# Patient Record
Sex: Female | Born: 1940
Health system: Southern US, Community
[De-identification: ages and names within clinical notes are randomized; demographics above are authoritative.]

## PROBLEM LIST (undated history)

## (undated) DIAGNOSIS — I10 Essential (primary) hypertension: Secondary | ICD-10-CM

## (undated) DIAGNOSIS — J449 Chronic obstructive pulmonary disease, unspecified: Secondary | ICD-10-CM

## (undated) DIAGNOSIS — C801 Malignant (primary) neoplasm, unspecified: Secondary | ICD-10-CM

## (undated) DIAGNOSIS — C50919 Malignant neoplasm of unspecified site of unspecified female breast: Secondary | ICD-10-CM

## (undated) HISTORY — PX: MASTECTOMY: SHX3

## (undated) HISTORY — PX: BREAST SURGERY: SHX581

## (undated) HISTORY — DX: Essential (primary) hypertension: I10

## (undated) HISTORY — DX: Chronic obstructive pulmonary disease, unspecified: J44.9

## (undated) HISTORY — PX: TUBAL LIGATION: SHX77

## (undated) HISTORY — PX: NECK SURGERY: SHX720

## (undated) HISTORY — DX: Malignant (primary) neoplasm, unspecified: C80.1

## (undated) LAB — HM MAMMOGRAPHY

---

## 1998-03-06 ENCOUNTER — Ambulatory Visit (HOSPITAL_COMMUNITY): Admission: RE | Admit: 1998-03-06 | Discharge: 1998-03-06 | Payer: Self-pay | Admitting: Oncology

## 1999-08-27 ENCOUNTER — Encounter: Admission: RE | Admit: 1999-08-27 | Discharge: 1999-08-27 | Payer: Self-pay | Admitting: Oncology

## 1999-08-27 ENCOUNTER — Encounter: Payer: Self-pay | Admitting: Oncology

## 1999-11-21 ENCOUNTER — Other Ambulatory Visit: Admission: RE | Admit: 1999-11-21 | Discharge: 1999-11-21 | Payer: Self-pay | Admitting: *Deleted

## 2000-09-03 ENCOUNTER — Encounter: Admission: RE | Admit: 2000-09-03 | Discharge: 2000-09-03 | Payer: Self-pay | Admitting: Oncology

## 2000-09-03 ENCOUNTER — Encounter: Payer: Self-pay | Admitting: Oncology

## 2001-01-10 ENCOUNTER — Encounter: Admission: RE | Admit: 2001-01-10 | Discharge: 2001-01-10 | Payer: Self-pay | Admitting: *Deleted

## 2001-01-10 ENCOUNTER — Encounter: Payer: Self-pay | Admitting: *Deleted

## 2001-09-29 ENCOUNTER — Encounter: Admission: RE | Admit: 2001-09-29 | Discharge: 2001-09-29 | Payer: Self-pay | Admitting: Internal Medicine

## 2001-09-29 ENCOUNTER — Encounter: Payer: Self-pay | Admitting: Internal Medicine

## 2002-06-14 ENCOUNTER — Encounter: Payer: Self-pay | Admitting: Internal Medicine

## 2002-06-14 ENCOUNTER — Encounter: Admission: RE | Admit: 2002-06-14 | Discharge: 2002-06-14 | Payer: Self-pay | Admitting: Internal Medicine

## 2002-09-18 ENCOUNTER — Other Ambulatory Visit: Admission: RE | Admit: 2002-09-18 | Discharge: 2002-09-18 | Payer: Self-pay | Admitting: Internal Medicine

## 2003-08-23 ENCOUNTER — Emergency Department (HOSPITAL_COMMUNITY): Admission: EM | Admit: 2003-08-23 | Discharge: 2003-08-23 | Payer: Self-pay | Admitting: Emergency Medicine

## 2003-09-10 ENCOUNTER — Encounter: Admission: RE | Admit: 2003-09-10 | Discharge: 2003-09-10 | Payer: Self-pay | Admitting: Internal Medicine

## 2004-09-08 ENCOUNTER — Encounter: Admission: RE | Admit: 2004-09-08 | Discharge: 2004-09-08 | Payer: Self-pay | Admitting: *Deleted

## 2004-09-19 ENCOUNTER — Ambulatory Visit (HOSPITAL_COMMUNITY): Admission: RE | Admit: 2004-09-19 | Discharge: 2004-09-19 | Payer: Self-pay | Admitting: Gastroenterology

## 2004-09-24 ENCOUNTER — Ambulatory Visit (HOSPITAL_COMMUNITY): Admission: RE | Admit: 2004-09-24 | Discharge: 2004-09-24 | Payer: Self-pay | Admitting: *Deleted

## 2004-10-10 ENCOUNTER — Ambulatory Visit: Payer: Self-pay | Admitting: Cardiology

## 2004-10-27 ENCOUNTER — Ambulatory Visit: Payer: Self-pay

## 2004-12-09 ENCOUNTER — Ambulatory Visit: Payer: Self-pay | Admitting: Cardiology

## 2005-10-16 ENCOUNTER — Encounter: Admission: RE | Admit: 2005-10-16 | Discharge: 2005-10-16 | Payer: Self-pay | Admitting: *Deleted

## 2005-12-07 ENCOUNTER — Encounter: Admission: RE | Admit: 2005-12-07 | Discharge: 2005-12-07 | Payer: Self-pay | Admitting: Internal Medicine

## 2006-11-22 ENCOUNTER — Encounter: Admission: RE | Admit: 2006-11-22 | Discharge: 2006-11-22 | Payer: Self-pay | Admitting: Internal Medicine

## 2007-09-14 ENCOUNTER — Emergency Department (HOSPITAL_COMMUNITY): Admission: EM | Admit: 2007-09-14 | Discharge: 2007-09-14 | Payer: Self-pay | Admitting: Emergency Medicine

## 2007-12-29 ENCOUNTER — Encounter: Admission: RE | Admit: 2007-12-29 | Discharge: 2007-12-29 | Payer: Self-pay | Admitting: Family Medicine

## 2008-02-06 ENCOUNTER — Encounter: Admission: RE | Admit: 2008-02-06 | Discharge: 2008-02-06 | Payer: Self-pay | Admitting: Family Medicine

## 2009-01-11 ENCOUNTER — Encounter: Admission: RE | Admit: 2009-01-11 | Discharge: 2009-01-11 | Payer: Self-pay | Admitting: Otolaryngology

## 2010-07-27 ENCOUNTER — Encounter: Payer: Self-pay | Admitting: Family Medicine

## 2011-06-24 ENCOUNTER — Ambulatory Visit (INDEPENDENT_AMBULATORY_CARE_PROVIDER_SITE_OTHER): Payer: Medicare Other

## 2011-06-24 DIAGNOSIS — J42 Unspecified chronic bronchitis: Secondary | ICD-10-CM

## 2012-03-29 ENCOUNTER — Other Ambulatory Visit: Payer: Self-pay | Admitting: Family Medicine

## 2012-03-29 DIAGNOSIS — Z9012 Acquired absence of left breast and nipple: Secondary | ICD-10-CM

## 2012-03-29 DIAGNOSIS — Z853 Personal history of malignant neoplasm of breast: Secondary | ICD-10-CM

## 2012-03-29 DIAGNOSIS — Z1231 Encounter for screening mammogram for malignant neoplasm of breast: Secondary | ICD-10-CM

## 2012-04-01 ENCOUNTER — Ambulatory Visit
Admission: RE | Admit: 2012-04-01 | Discharge: 2012-04-01 | Disposition: A | Discharge status: Home / Self Care | Payer: No Typology Code available for payment source | Source: Ambulatory Visit | Attending: Family Medicine | Admitting: Family Medicine

## 2012-04-01 DIAGNOSIS — Z1231 Encounter for screening mammogram for malignant neoplasm of breast: Secondary | ICD-10-CM | POA: Diagnosis not present

## 2012-04-01 DIAGNOSIS — Z9012 Acquired absence of left breast and nipple: Secondary | ICD-10-CM

## 2012-04-01 DIAGNOSIS — Z853 Personal history of malignant neoplasm of breast: Secondary | ICD-10-CM

## 2012-06-08 DIAGNOSIS — Z23 Encounter for immunization: Secondary | ICD-10-CM | POA: Diagnosis not present

## 2012-11-12 ENCOUNTER — Ambulatory Visit (INDEPENDENT_AMBULATORY_CARE_PROVIDER_SITE_OTHER): Payer: Medicare Other | Admitting: Family Medicine

## 2012-11-12 VITALS — BP 139/80 | HR 82 | Temp 98.4°F | Resp 16 | Ht 64.0 in | Wt 148.0 lb

## 2012-11-12 DIAGNOSIS — J441 Chronic obstructive pulmonary disease with (acute) exacerbation: Secondary | ICD-10-CM | POA: Diagnosis not present

## 2012-11-12 MED ORDER — DOXYCYCLINE HYCLATE 100 MG PO CAPS: 100.0000 mg | ORAL_CAPSULE | Freq: Two times a day (BID) | ORAL | Status: DC

## 2012-11-12 MED ORDER — ALBUTEROL SULFATE HFA 108 (90 BASE) MCG/ACT IN AERS: 2.0000 | INHALATION_SPRAY | RESPIRATORY_TRACT | Status: DC | PRN

## 2012-11-12 MED ORDER — HYDROCOD POLST-CHLORPHEN POLST 10-8 MG/5ML PO LQCR: 5.0000 mL | Freq: Two times a day (BID) | ORAL | Status: DC | PRN

## 2012-11-12 MED ORDER — PREDNISONE 20 MG PO TABS: 40.0000 mg | ORAL_TABLET | Freq: Every day | ORAL | Status: DC

## 2012-11-12 NOTE — Patient Instructions (Addendum)

## 2012-11-12 NOTE — Progress Notes (Signed)
Subjective:    Patient ID: Maria Olsen, female    DOB: 1941-03-22, 72 y.o.   MRN: [id number]  HPI  Woke up 3d ago w/ severe sore thraot, HA, ache, felt horrible  Since then has started coughing and bringing up thick [place] mucous so knows she has bronchitis and severe fatigue.  Has had some chills and low grade fever - initally thought strep throat. Left ear bothering her a little and bad HAs but no sinus pressure.  Taking ibuprofen and tylenol p.m.  Has had nasal congestion and shortness of breathness, no wheezing, no CP/tightness, tol po, decreased appetite. Not sleeping due to cough.  Has tried some tussion DM.  Past Medical History  Diagnosis Date  . Cancer   . Hypertension   . COPD (chronic obstructive pulmonary disease)    No current outpatient prescriptions on file prior to visit.   No current facility-administered medications on file prior to visit.     Review of Systems  Constitutional: Positive for fever, chills, activity change, appetite change and fatigue. Negative for diaphoresis and unexpected weight change.  HENT: Positive for ear pain, congestion, sore throat and rhinorrhea. Negative for sneezing, postnasal drip and sinus pressure.   Respiratory: Positive for cough and shortness of breath. Negative for chest tightness and wheezing.   Cardiovascular: Negative for chest pain and palpitations.  Gastrointestinal: Negative for nausea, vomiting, diarrhea and constipation.  Genitourinary: Negative for dysuria, urgency and decreased urine volume.  Musculoskeletal: Positive for myalgias and arthralgias.  Neurological: Positive for headaches.  Psychiatric/Behavioral: Positive for sleep disturbance.      BP 139/80  Pulse 82  Temp(Src) 98.4 F (36.9 C) (Oral)  Resp 16  Ht 5\' 4"  (1.626 m)  Wt 148 lb (67.132 kg)  BMI 25.39 kg/m2  SpO2 96% Objective:   Physical Exam  Constitutional: She is oriented to person, place, and time. She appears well-developed and well-nourished. No  distress.  HENT:  Head: Normocephalic and atraumatic.  Right Ear: External ear normal.  Left Ear: External ear normal.  Nose: Nose normal.  Eyes: Conjunctivae are normal. Right eye exhibits no discharge. Left eye exhibits no discharge. No scleral icterus.  Neck: Neck supple. No thyromegaly present.  Cardiovascular: Normal rate, regular rhythm and normal heart sounds.   Pulmonary/Chest: Effort normal. No accessory muscle usage. No respiratory distress. She has no decreased breath sounds. She has no wheezes. She has rhonchi in the right upper field, the right lower field and the left lower field. She has no rales.  Talks in complete sentences and walks through clinic w/o apparent dyspnea on RA.  Musculoskeletal: She exhibits no edema and no tenderness.  Lymphadenopathy:    She has no cervical adenopathy.  Neurological: She is alert and oriented to person, place, and time.  Skin: Skin is warm and dry. She is not diaphoretic. No erythema.  Psychiatric: She has a normal mood and affect. Her behavior is normal.          Assessment & Plan:  COPD exacerbation - If getting any worse or not improved in 1 wk, RTC for cbc and cxr.  No interest in smoking cessation at this time. No interest in further COPD testing or routine inhalers at this time.  Meds ordered this encounter  Medications  . hydrochlorothiazide (HYDRODIURIL) 12.5 MG tablet    Sig: Take 12.5 mg by mouth daily.  Marland Kitchen doxycycline (VIBRAMYCIN) 100 MG capsule    Sig: Take 1 capsule (100 mg total) by mouth 2 (two) times  daily.    Dispense:  20 capsule    Refill:  0  . predniSONE (DELTASONE) 20 MG tablet    Sig: Take 2 tablets (40 mg total) by mouth daily.    Dispense:  10 tablet    Refill:  0  . albuterol (PROVENTIL HFA;VENTOLIN HFA) 108 (90 BASE) MCG/ACT inhaler    Sig: Inhale 2 puffs into the lungs every 4 (four) hours as needed for wheezing (cough, shortness of breath or wheezing.).    Dispense:  1 Inhaler    Refill:  1  .  chlorpheniramine-HYDROcodone (TUSSIONEX PENNKINETIC ER) 10-8 MG/5ML LQCR    Sig: Take 5 mLs by mouth every 12 (twelve) hours as needed (cough).    Dispense:  140 mL    Refill:  0

## 2013-05-02 DIAGNOSIS — Z23 Encounter for immunization: Secondary | ICD-10-CM | POA: Diagnosis not present

## 2013-06-30 ENCOUNTER — Ambulatory Visit (INDEPENDENT_AMBULATORY_CARE_PROVIDER_SITE_OTHER): Payer: Medicare Other | Admitting: Internal Medicine

## 2013-06-30 ENCOUNTER — Ambulatory Visit: Payer: Medicare Other

## 2013-06-30 VITALS — BP 118/76 | HR 93 | Temp 99.8°F | Resp 16 | Ht 64.0 in | Wt 148.4 lb

## 2013-06-30 DIAGNOSIS — R059 Cough, unspecified: Secondary | ICD-10-CM

## 2013-06-30 DIAGNOSIS — R05 Cough: Secondary | ICD-10-CM

## 2013-06-30 DIAGNOSIS — R062 Wheezing: Secondary | ICD-10-CM | POA: Diagnosis not present

## 2013-06-30 DIAGNOSIS — J42 Unspecified chronic bronchitis: Secondary | ICD-10-CM | POA: Diagnosis not present

## 2013-06-30 DIAGNOSIS — I10 Essential (primary) hypertension: Secondary | ICD-10-CM | POA: Insufficient documentation

## 2013-06-30 DIAGNOSIS — J209 Acute bronchitis, unspecified: Secondary | ICD-10-CM

## 2013-06-30 LAB — POCT CBC
Lymph, poc: 1.2 (ref 0.6–3.4)
MCH, POC: 29.4 pg (ref 27–31.2)
MCHC: 30.2 g/dL — AB (ref 31.8–35.4)
MCV: 97.2 fL — AB (ref 80–97)
MID (cbc): 0.8 (ref 0–0.9)
POC LYMPH PERCENT: 12.3 %L (ref 10–50)
Platelet Count, POC: 211 10*3/uL (ref 142–424)
RDW, POC: 15.6 %
WBC: 9.8 10*3/uL (ref 4.6–10.2)

## 2013-06-30 MED ORDER — ALBUTEROL SULFATE HFA 108 (90 BASE) MCG/ACT IN AERS: 2.0000 | INHALATION_SPRAY | Freq: Four times a day (QID) | RESPIRATORY_TRACT | Status: DC | PRN

## 2013-06-30 MED ORDER — IPRATROPIUM BROMIDE 0.02 % IN SOLN
0.5000 mg | Freq: Once | RESPIRATORY_TRACT | Status: AC
  Administered 2013-06-30: 0.5 mg via RESPIRATORY_TRACT

## 2013-06-30 MED ORDER — PREDNISONE 20 MG PO TABS: ORAL_TABLET | ORAL | Status: DC

## 2013-06-30 MED ORDER — LEVOFLOXACIN 500 MG PO TABS: 500.0000 mg | ORAL_TABLET | Freq: Every day | ORAL | Status: DC

## 2013-06-30 MED ORDER — HYDROCODONE-HOMATROPINE 5-1.5 MG/5ML PO SYRP: 5.0000 mL | ORAL_SOLUTION | Freq: Four times a day (QID) | ORAL | Status: DC | PRN

## 2013-06-30 MED ORDER — ALBUTEROL SULFATE (2.5 MG/3ML) 0.083% IN NEBU
2.5000 mg | INHALATION_SOLUTION | Freq: Once | RESPIRATORY_TRACT | Status: AC
  Administered 2013-06-30: 2.5 mg via RESPIRATORY_TRACT

## 2013-06-30 NOTE — Progress Notes (Signed)
   Subjective:    Patient ID: Maria Olsen, female    DOB: 06-23-1941, 72 y.o.   MRN: [id number]  HPI complaining of coughing wheezing and fever for 4-5 days-getting steadily worse Couldn't sleep last night/cough became slightly productive Mild sore throat No nasal congestion History of chronic lung problems though has never needed more than albuterol Current albuterol about to expire Long history of smoking  There are no active problems to display for this patient.  has hypertension  Current outpatient prescriptions:albuterol (PROVENTIL HFA;VENTOLIN HFA) 108 (90 BASE) MCG/ACT inhaler, Inhale 2 puffs into the lungs every 4 (four) hours as needed for wheezing (cough, shortness of breath or wheezing.)., Disp: 1 Inhaler, Rfl: 1;  hydrochlorothiazide (HYDRODIURIL) 12.5 MG tablet, Take 12.5 mg by mouth daily.  Review of Systems No night sweats/no chills/no recent weight loss No nausea vomiting diarrhea No urinary symptoms No rash or joint problems    Objective:   Physical Exam BP 118/76  Pulse 93  Temp(Src) 99.8 F (37.7 C) (Oral)  Resp 16  Ht 5\' 4"  (1.626 m)  Wt 148 lb 6.4 oz (67.314 kg)  BMI 25.46 kg/m2  SpO2 89% No acute distress       [facility] reading (PRIMARY) by  Dr. Josephina Gip infiltrates//flat diaphragms  Results for orders placed in visit on 06/30/13  POCT CBC      Result Value Range   WBC 9.8  4.6 - 10.2 K/uL   Lymph, poc 1.2  0.6 - 3.4   POC LYMPH PERCENT 12.3  10 - 50 %L   MID (cbc) 0.8  0 - 0.9   POC MID % 7.8  0 - 12 %M   POC Granulocyte 7.8 (*) 2 - 6.9   Granulocyte percent 79.9  37 - 80 %G   RBC 4.76  4.04 - 5.48 M/uL   Hemoglobin 14.0  12.2 - 16.2 g/dL   HCT, POC 40.9  81.1 - 47.9 %   MCV 97.2 (*) 80 - 97 fL   MCH, POC 29.4  27 - 31.2 pg   MCHC 30.2 (*) 31.8 - 35.4 g/dL   RDW, POC 91.4     Platelet Count, POC 211  142 - 424 K/uL   MPV 9.2  0 - 99.8 fL    Assessment & Plan:

## 2013-06-30 NOTE — Progress Notes (Deleted)
   Subjective:    Patient ID: Maria Olsen, female    DOB: 1940-07-26, 72 y.o.   MRN: [id number]  HPI pt here c/o of cough, wheezing, and fever.     Review of Systems     Objective:   Physical Exam        Assessment & Plan:

## 2013-09-06 ENCOUNTER — Other Ambulatory Visit: Payer: Self-pay

## 2013-09-06 DIAGNOSIS — Z1231 Encounter for screening mammogram for malignant neoplasm of breast: Secondary | ICD-10-CM

## 2013-09-06 DIAGNOSIS — Z9012 Acquired absence of left breast and nipple: Secondary | ICD-10-CM

## 2013-09-27 ENCOUNTER — Ambulatory Visit
Admission: RE | Admit: 2013-09-27 | Discharge: 2013-09-27 | Disposition: A | Discharge status: Home / Self Care | Payer: Medicare Other | Source: Ambulatory Visit

## 2013-09-27 DIAGNOSIS — Z9012 Acquired absence of left breast and nipple: Secondary | ICD-10-CM

## 2013-09-27 DIAGNOSIS — Z1231 Encounter for screening mammogram for malignant neoplasm of breast: Secondary | ICD-10-CM | POA: Diagnosis not present

## 2014-01-21 ENCOUNTER — Ambulatory Visit (INDEPENDENT_AMBULATORY_CARE_PROVIDER_SITE_OTHER): Payer: Medicare Other | Admitting: Family Medicine

## 2014-01-21 VITALS — BP 138/66 | HR 75 | Temp 98.2°F | Resp 16 | Ht 65.0 in | Wt 149.0 lb

## 2014-01-21 DIAGNOSIS — J22 Unspecified acute lower respiratory infection: Secondary | ICD-10-CM

## 2014-01-21 DIAGNOSIS — R05 Cough: Secondary | ICD-10-CM | POA: Diagnosis not present

## 2014-01-21 DIAGNOSIS — R059 Cough, unspecified: Secondary | ICD-10-CM

## 2014-01-21 DIAGNOSIS — J988 Other specified respiratory disorders: Secondary | ICD-10-CM | POA: Diagnosis not present

## 2014-01-21 MED ORDER — AZITHROMYCIN 250 MG PO TABS: ORAL_TABLET | ORAL | Status: DC

## 2014-01-21 NOTE — Patient Instructions (Addendum)
Start zpak, albuterol if needed for cough or wheeze, but if you need to use albuterol more than twice per day, persistently needing this more than the next few days or any worsening of cough or wheezing - return for recheck.   Return to the clinic or go to the nearest emergency room if any of your symptoms worsen or new symptoms occur. Bronchitis Bronchitis is inflammation of the airways that extend from the windpipe into the lungs (bronchi). The inflammation often causes mucus to develop, which leads to a cough. If the inflammation becomes severe, it may cause shortness of breath. CAUSES  Bronchitis may be caused by:   Viral infections.   Bacteria.   Cigarette smoke.   Allergens, pollutants, and other irritants.  SIGNS AND SYMPTOMS  The most common symptom of bronchitis is a frequent cough that produces mucus. Other symptoms include:  Fever.   Body aches.   Chest congestion.   Chills.   Shortness of breath.   Sore throat.  DIAGNOSIS  Bronchitis is usually diagnosed through a medical history and physical exam. Tests, such as chest X-rays, are sometimes done to rule out other conditions.  TREATMENT  You may need to avoid contact with whatever caused the problem (smoking, for example). Medicines are sometimes needed. These may include:  Antibiotics. These may be prescribed if the condition is caused by bacteria.  Cough suppressants. These may be prescribed for relief of cough symptoms.   Inhaled medicines. These may be prescribed to help open your airways and make it easier for you to breathe.   Steroid medicines. These may be prescribed for those with recurrent (chronic) bronchitis. HOME CARE INSTRUCTIONS  Get plenty of rest.   Drink enough fluids to keep your urine clear or pale yellow (unless you have a medical condition that requires fluid restriction). Increasing fluids may help thin your secretions and will prevent dehydration.   Only take  over-the-counter or prescription medicines as directed by your health care provider.  Only take antibiotics as directed. Make sure you finish them even if you start to feel better.  Avoid secondhand smoke, irritating chemicals, and strong fumes. These will make bronchitis worse. If you are a smoker, quit smoking. Consider using nicotine gum or skin patches to help control withdrawal symptoms. Quitting smoking will help your lungs heal faster.   Put a cool-mist humidifier in your bedroom at night to moisten the air. This may help loosen mucus. Change the water in the humidifier daily. You can also run the hot water in your shower and sit in the bathroom with the door closed for 5-10 minutes.   Follow up with your health care provider as directed.   Wash your hands frequently to avoid catching bronchitis again or spreading an infection to others.  SEEK MEDICAL CARE IF: Your symptoms do not improve after 1 week of treatment.  SEEK IMMEDIATE MEDICAL CARE IF:  Your fever increases.  You have chills.   You have chest pain.   You have worsening shortness of breath.   You have bloody sputum.  You faint.  You have lightheadedness.  You have a severe headache.   You vomit repeatedly. MAKE SURE YOU:   Understand these instructions.  Will watch your condition.  Will get help right away if you are not doing well or get worse. Document Released: 06/22/2005 Document Revised: 04/12/2013 Document Reviewed: 02/14/2013 [facility] Patient Information 2015 [place], [place]. This information is not intended to replace advice given to you by your health  care provider. Make sure you discuss any questions you have with your health care provider.

## 2014-01-21 NOTE — Progress Notes (Addendum)
Subjective:   This chart was scribed for Maria Ray, MD by Maria Olsen, [facility] Scribe. This patient was seen in room 4 and the patient's care was started 10:11 AM.     Patient ID: Maria Olsen, female    DOB: 02/02/1941, 73 y.o.   MRN: [id number]  HPI  HPI Comments: Maria Olsen is a 73 y.o. female who presents to [facility] with a PMHx of HTN and COPD with albuterol prescription as needed. Most recently seen 06/2013 and was treated with Albuterol and Atrovent nebulizer. Also seen in 11/2013 for COPD exacerbation. At that time pt was treated with Prednisone and Doxycycline. She now c/o a constant, moderate productive cough consisting of a thick green/gray mucous x 4 days. Pt states her cough subsided the following day after onset, however, she states cough has returned and worsened in the last 24 hours. She also reports some fatigue, chills, and HA at this time. She has not tried any OTC medications or home remedies to help manage symptoms. She admits to using her inhaler once this morning. However, she has used her inhaler while on a recent trip to [place]. Pt states she has used her inhaler about 3 times in the last week during this sickness. However, she states at baseline inhaler use is intermittent. She denies any fever, chills, CP, or SOB. Pt with known allergies to Monocid and Demerol. No other concerns this visit.  Patient Active Problem List   Diagnosis Date Noted  . HTN (hypertension) 06/30/2013   Past Medical History  Diagnosis Date  . Cancer   . Hypertension   . COPD (chronic obstructive pulmonary disease)    Past Surgical History  Procedure Laterality Date  . Breast surgery    . Cesarean section    . Neck surgery     Allergies  Allergen Reactions  . Demerol [Meperidine] Nausea And Vomiting  . Monocid [Cefonicid] Nausea And Vomiting   Prior to Admission medications   Medication Sig Start Date End Date  Taking? Authorizing Provider  albuterol (PROVENTIL HFA;VENTOLIN HFA) 108 (90 BASE) MCG/ACT inhaler Inhale 2 puffs into the lungs every 6 (six) hours as needed for wheezing or shortness of breath. 06/30/13  Yes Leandrew Koyanagi, MD   History   Social History  . Marital Status: Married    Spouse Name: N/A    Number of Children: N/A  . Years of Education: N/A   Occupational History  . Not on file.   Social History Main Topics  . Smoking status: Current Every Day Smoker    Types: Cigarettes  . Smokeless tobacco: Not on file  . Alcohol Use: Not on file  . Drug Use: Not on file  . Sexual Activity: Not on file   Other Topics Concern  . Not on file   Social History Narrative  . No narrative on file     Review of Systems  Constitutional: Positive for chills and fatigue. Negative for fever and activity change.  Respiratory: Positive for cough.   Skin: Negative for rash.  Neurological: Positive for headaches.  Psychiatric/Behavioral: Negative for confusion.     Objective:  Physical Exam  Vitals reviewed. Constitutional: She is oriented to person, place, and time. She appears well-developed and well-nourished. No distress.  HENT:  Head: Normocephalic and atraumatic.  Right Ear: Hearing, tympanic membrane, external ear and ear canal normal.  Left Ear: Hearing, tympanic membrane, external ear and ear  canal normal.  Nose: Nose normal.  Mouth/Throat: Oropharynx is clear and moist. No oropharyngeal exudate.  Eyes: Conjunctivae and EOM are normal. Pupils are equal, round, and reactive to light.  Cardiovascular: Normal rate, regular rhythm, normal heart sounds and intact distal pulses.   No murmur heard. Pulmonary/Chest: Effort normal and breath sounds normal. No respiratory distress. She has no wheezes. She has no rhonchi. She has no rales.  Neurological: She is alert and oriented to person, place, and time.  Skin: Skin is warm and dry. No rash noted.  Psychiatric: She has a  normal mood and affect. Her behavior is normal.     Filed Vitals:   01/21/14 0953  BP: 138/66  Pulse: 75  Temp: 98.2 F (36.8 C)  Resp: 16  Height: 5\' 5"  (1.651 m)  Weight: 149 lb (67.586 kg)  SpO2: 95%      Assessment & Plan:   Maria Olsen is a 73 y.o. female Cough - Plan: azithromycin (ZITHROMAX) 250 MG tablet  Lower respiratory infection - Plan: azithromycin (ZITHROMAX) 250 MG tablet  Possible initial viral infection, with secondary sickening last night. No wheeze on exam today, and only occasional albuterol use - does not appear to be full COPD exacerbation at this time. Will start Zpak (some GI intolerance to doxycycline prior). Albuterol if needed, and if increased albuterol use, or worsening - rtc as may need prednisone or further eval.   Will schedule physical, and can discuss COPD at that time to determine if daily med needed.   rtc precautions.    Meds ordered this encounter  Medications  . azithromycin (ZITHROMAX) 250 MG tablet    Sig: Take 2 pills by mouth on day 1, then 1 pill by mouth per day on days 2 through 5.    Dispense:  5 tablet    Refill:  0   Patient Instructions  Start zpak, albuterol if needed for cough or wheeze, but if you need to use albuterol more than twice per day, persistently needing this more than the next few days or any worsening of cough or wheezing - return for recheck.   Return to the clinic or go to the nearest emergency room if any of your symptoms worsen or new symptoms occur. Bronchitis Bronchitis is inflammation of the airways that extend from the windpipe into the lungs (bronchi). The inflammation often causes mucus to develop, which leads to a cough. If the inflammation becomes severe, it may cause shortness of breath. CAUSES  Bronchitis may be caused by:   Viral infections.   Bacteria.   Cigarette smoke.   Allergens, pollutants, and other irritants.  SIGNS AND SYMPTOMS  The most common symptom of bronchitis is  a frequent cough that produces mucus. Other symptoms include:  Fever.   Body aches.   Chest congestion.   Chills.   Shortness of breath.   Sore throat.  DIAGNOSIS  Bronchitis is usually diagnosed through a medical history and physical exam. Tests, such as chest X-rays, are sometimes done to rule out other conditions.  TREATMENT  You may need to avoid contact with whatever caused the problem (smoking, for example). Medicines are sometimes needed. These may include:  Antibiotics. These may be prescribed if the condition is caused by bacteria.  Cough suppressants. These may be prescribed for relief of cough symptoms.   Inhaled medicines. These may be prescribed to help open your airways and make it easier for you to breathe.   Steroid medicines. These may be  prescribed for those with recurrent (chronic) bronchitis. HOME CARE INSTRUCTIONS  Get plenty of rest.   Drink enough fluids to keep your urine clear or pale yellow (unless you have a medical condition that requires fluid restriction). Increasing fluids may help thin your secretions and will prevent dehydration.   Only take over-the-counter or prescription medicines as directed by your health care provider.  Only take antibiotics as directed. Make sure you finish them even if you start to feel better.  Avoid secondhand smoke, irritating chemicals, and strong fumes. These will make bronchitis worse. If you are a smoker, quit smoking. Consider using nicotine gum or skin patches to help control withdrawal symptoms. Quitting smoking will help your lungs heal faster.   Put a cool-mist humidifier in your bedroom at night to moisten the air. This may help loosen mucus. Change the water in the humidifier daily. You can also run the hot water in your shower and sit in the bathroom with the door closed for 5-10 minutes.   Follow up with your health care provider as directed.   Wash your hands frequently to avoid catching  bronchitis again or spreading an infection to others.  SEEK MEDICAL CARE IF: Your symptoms do not improve after 1 week of treatment.  SEEK IMMEDIATE MEDICAL CARE IF:  Your fever increases.  You have chills.   You have chest pain.   You have worsening shortness of breath.   You have bloody sputum.  You faint.  You have lightheadedness.  You have a severe headache.   You vomit repeatedly. MAKE SURE YOU:   Understand these instructions.  Will watch your condition.  Will get help right away if you are not doing well or get worse. Document Released: 06/22/2005 Document Revised: 04/12/2013 Document Reviewed: 02/14/2013 [facility] Patient Information 2015 [place], [place]. This information is not intended to replace advice given to you by your health care provider. Make sure you discuss any questions you have with your health care provider.    I personally performed the services described in this documentation, which was scribed in my presence. The recorded information has been reviewed and considered, and addended by me as needed.

## 2014-01-22 NOTE — Progress Notes (Signed)
CPE scheduled for 05/21/14

## 2014-01-23 DIAGNOSIS — H2510 Age-related nuclear cataract, unspecified eye: Secondary | ICD-10-CM | POA: Diagnosis not present

## 2014-01-23 DIAGNOSIS — H40039 Anatomical narrow angle, unspecified eye: Secondary | ICD-10-CM | POA: Diagnosis not present

## 2014-03-25 ENCOUNTER — Ambulatory Visit (INDEPENDENT_AMBULATORY_CARE_PROVIDER_SITE_OTHER): Payer: Medicare Other | Admitting: Emergency Medicine

## 2014-03-25 VITALS — BP 126/62 | HR 76 | Temp 97.7°F | Resp 20 | Ht 64.0 in | Wt 142.1 lb

## 2014-03-25 DIAGNOSIS — J019 Acute sinusitis, unspecified: Secondary | ICD-10-CM | POA: Diagnosis not present

## 2014-03-25 MED ORDER — AMOXICILLIN 875 MG PO TABS: 875.0000 mg | ORAL_TABLET | Freq: Two times a day (BID) | ORAL | Status: DC

## 2014-03-25 NOTE — Progress Notes (Addendum)
Subjective:    Patient ID: Maria Olsen, female    DOB: 20-Apr-1941, 73 y.o.   MRN: [id number] This chart was scribed for Fraser. Everlene Farrier, MD by Steva Colder, ED Scribe. The patient was seen in room 3 at 1:34 PM.   Chief Complaint  Patient presents with  . Sinusitis    since Wednesday    HPI Maria Olsen is a 73 y.o. female with a medical hx of HTN and COPD who presents today complaining of sinusitis onset 4 days. She states that on wednesday, she thought she was having allergies. She states that on thursday, she had a HA and sinus pressure. She states that on Friday, there was mucous that was gray and some was chunks [place] and bloody. She states that she is having associated symptoms of diaphoresis, fever, dizziness, and HA. She states that she will get shooting pains sometimes and it will go up to her ear.   She states that she has tried IBU with no relief for her symptoms. She denies any other associated symptoms. She states that she is an ex-smoker and she quit in December 2104. She states that she has to use her albuterol sometimes 4 times a day. She states that this is a bad time of the year for her allergies. She states that she is allergic to Demerol and Monocid. She states that she does not like medications with codeine in it. She denies using saline sprays. She states that she has not used Zyrtec or Claritin for the symptoms. She states that she has had septaplasty gone bad. She states that she no longer has a septum.    PCP-GREENE,JEFFREY R, MD      Patient Active Problem List   Diagnosis Date Noted  . HTN (hypertension) 06/30/2013   Past Medical History  Diagnosis Date  . Cancer   . Hypertension   . COPD (chronic obstructive pulmonary disease)    Past Surgical History  Procedure Laterality Date  . Breast surgery    . Cesarean section    . Neck surgery     Allergies  Allergen Reactions  . Demerol [Meperidine] Nausea And Vomiting  . Monocid [Cefonicid] Nausea And  Vomiting   Prior to Admission medications   Medication Sig Start Date End Date Taking? Authorizing Provider  albuterol (PROVENTIL HFA;VENTOLIN HFA) 108 (90 BASE) MCG/ACT inhaler Inhale 2 puffs into the lungs every 6 (six) hours as needed for wheezing or shortness of breath. 06/30/13  Yes Leandrew Koyanagi, MD      Review of Systems  Constitutional: Positive for diaphoresis. Negative for fever.  HENT: Positive for congestion.   Neurological: Positive for dizziness and headaches.       Objective:   Physical Exam  Nursing note and vitals reviewed. Constitutional: She is oriented to person, place, and time. She appears well-developed and well-nourished. No distress.  HENT:  Head: Normocephalic and atraumatic.  Inflammation and crusting present in the nasal pharynx. Nasal septum is absent.    Eyes: EOM are normal.  Neck: Neck supple.  Cardiovascular: Normal rate.   Pulmonary/Chest: Effort normal. No respiratory distress.  Musculoskeletal: Normal range of motion.  Neurological: She is alert and oriented to person, place, and time.  Skin: Skin is warm and dry.  Psychiatric: She has a normal mood and affect. Her behavior is normal.    Meds ordered this encounter  Medications  . amoxicillin (AMOXIL) 875 MG tablet    Sig: Take 1 tablet (875 mg  total) by mouth 2 (two) times daily.    Dispense:  20 tablet    Refill:  0       BP 126/62  Pulse 76  Temp(Src) 97.7 F (36.5 C) (Oral)  Resp 20  Ht 5\' 4"  (1.626 m)  Wt 142 lb 2 oz (64.467 kg)  BMI 24.38 kg/m2  SpO2 95%  Assessment & Plan:  I personally performed the services described in this documentation, which was scribed in my presence. The recorded information has been reviewed and is accurate.  Pt states that she is able to take penicillin without any issues.    Will treat with amoxicillin and saline spray. If her dizziness persists after Abx treatment, pt needs to return to clinic for re-evaluation.

## 2014-03-25 NOTE — Patient Instructions (Signed)

## 2014-04-28 DIAGNOSIS — Z23 Encounter for immunization: Secondary | ICD-10-CM | POA: Diagnosis not present

## 2014-05-21 ENCOUNTER — Encounter: Payer: Self-pay | Admitting: Family Medicine

## 2014-05-21 ENCOUNTER — Ambulatory Visit (INDEPENDENT_AMBULATORY_CARE_PROVIDER_SITE_OTHER): Payer: Medicare Other | Admitting: Family Medicine

## 2014-05-21 VITALS — BP 147/73 | HR 74 | Temp 98.2°F | Resp 16 | Ht 64.0 in | Wt 144.4 lb

## 2014-05-21 DIAGNOSIS — E2839 Other primary ovarian failure: Secondary | ICD-10-CM

## 2014-05-21 DIAGNOSIS — Z23 Encounter for immunization: Secondary | ICD-10-CM

## 2014-05-21 DIAGNOSIS — M949 Disorder of cartilage, unspecified: Secondary | ICD-10-CM

## 2014-05-21 DIAGNOSIS — Z139 Encounter for screening, unspecified: Secondary | ICD-10-CM | POA: Diagnosis not present

## 2014-05-21 DIAGNOSIS — Z0000 Encounter for general adult medical examination without abnormal findings: Secondary | ICD-10-CM | POA: Diagnosis not present

## 2014-05-21 DIAGNOSIS — Z13220 Encounter for screening for lipoid disorders: Secondary | ICD-10-CM | POA: Diagnosis not present

## 2014-05-21 DIAGNOSIS — J42 Unspecified chronic bronchitis: Secondary | ICD-10-CM | POA: Diagnosis not present

## 2014-05-21 DIAGNOSIS — M899 Disorder of bone, unspecified: Secondary | ICD-10-CM | POA: Diagnosis not present

## 2014-05-21 DIAGNOSIS — M8580 Other specified disorders of bone density and structure, unspecified site: Secondary | ICD-10-CM

## 2014-05-21 DIAGNOSIS — Z131 Encounter for screening for diabetes mellitus: Secondary | ICD-10-CM

## 2014-05-21 LAB — COMPLETE METABOLIC PANEL WITH GFR
ALBUMIN: 4.3 g/dL (ref 3.5–5.2)
ALT: 13 U/L (ref 0–35)
AST: 20 U/L (ref 0–37)
Alkaline Phosphatase: 69 U/L (ref 39–117)
BUN: 14 mg/dL (ref 6–23)
CALCIUM: 9.6 mg/dL (ref 8.4–10.5)
CHLORIDE: 101 meq/L (ref 96–112)
CO2: 27 meq/L (ref 19–32)
CREATININE: 0.95 mg/dL (ref 0.50–1.10)
GFR, Est African American: 69 mL/min
GFR, Est Non African American: 60 mL/min
GLUCOSE: 82 mg/dL (ref 70–99)
POTASSIUM: 4.1 meq/L (ref 3.5–5.3)
Sodium: 140 mEq/L (ref 135–145)
TOTAL PROTEIN: 7.4 g/dL (ref 6.0–8.3)
Total Bilirubin: 0.6 mg/dL (ref 0.2–1.2)

## 2014-05-21 LAB — LIPID PANEL
Cholesterol: 161 mg/dL (ref 0–200)
HDL: 63 mg/dL (ref >39)
LDL CALC: 81 mg/dL (ref 0–99)
Total CHOL/HDL Ratio: 2.6 Ratio
Triglycerides: 86 mg/dL (ref <150)
VLDL: 17 mg/dL (ref 0–40)

## 2014-05-21 MED ORDER — ZOSTER VACCINE LIVE 19400 UNT/0.65ML ~~LOC~~ SOLR: 0.6500 mL | Freq: Once | SUBCUTANEOUS | Status: DC

## 2014-05-21 MED ORDER — TIOTROPIUM BROMIDE MONOHYDRATE 18 MCG IN CAPS: 18.0000 ug | ORAL_CAPSULE | Freq: Every day | RESPIRATORY_TRACT | Status: DC

## 2014-05-21 NOTE — Progress Notes (Signed)
Subjective:    Patient ID: Maria Olsen, female    DOB: Feb 05, 1941, 73 y.o.   MRN: [id number] This chart was scribed for Maria Agreste, MD by Cathie Hoops, ED Scribe. The patient was seen in Room 24. The patient's care was started at 2:12 PM.   05/21/2014  Chief Complaint  Patient presents with  . Annual Exam    without pap; pt has not fasted    HPI HPI Comments: Pt is here for her annual exam.  Maria Olsen is a 73 y.o. female who presents to the [facility] here for a complete physical exam. Pt denies having any specific medical concerns today.  COPD: She was last seen by me on 7/19 for a cough and suspected COPD exacerbation, but mild symptoms at that time. Seen in follow-up on 7/ by Dr. Everlene Farrier for sinusitis. Of note she was seen twice in 2014 for cough and suspected COPD exacerbation. Her weight in December 2014 was 148 she is 144. Today. She has not had a recent spirometry but does have albuterol if needed.   Pt is aware of having COPD, she had a low spirometry test in the past but is unable to specify the date she last received the test. She does not want to have a repeat exam but would like to continue on as if she has COPD. She denies spirometry testing today. Pt notes she currently smokes 1-2 cigarettes/day which is down from 1-2 packs/day.Pt notes she has attempted to lose weight on purpose through diet and exercise. She notes she has been using her albuterol <1x/day several times per week. Which is increased usage from her last visit in 01/2014. Pt denies ever attempting Spiriva. Pt notes associated SOB. She notes her symptoms occur when walking around a city block or walking up a flight of stairs. She notes she failed Chantix previously due to the side effects. She notes she "didn't feel right" but does not note any specific side effects.  Health Maintenance  1.) Colon Cancer Screening Her last colonoscopy was in 10/2010 with a repeat in 5 years due to  polyps.  2.) Pap Smear/ Pap Testing Pt denies pap smear and notes she has never had an abnormal pap smear. She notes she had a pap smear after age 89 and it was normal. She denies any new lumps, bumps, rashes, itching or irritation. Pt denies having issues with incontinence. Pt notes she has had some accidents "dribbling" and notes this is a more recent. She notes she does not go to the restroom frequently and sometimes believes her symptoms are due to this. She denies any incontinence with sneezing or coughing.   3.) Mammogram/Bone Density She had a normal MMG with repeat recommended in one year on 09/27/13.   Her last bone density exam was 02/2008 due to her osteopenia with a T-score of -2.3 but that was no significant change since 2007. She has previously taken Phosphomax and Neo-Calcium Nasal Spray. She was on Phosphomax once/week in 2012 by Dr. Everlene Farrier. She notes she took it more than 5+ years but currently not taking it. She states she occasionally takes Vitamin D and calcium supplements. She notes her mother had severe osteoporosis but denies she had COPD. She notes her last cup of coffee with cream in it was approximately 3 hours ago.   4.) Immunizations       A. Pneumonia Vaccine: She denies ever receiving Prevnar or Pneumovax.  B. Influenza Vaccine: She receives she received the high-dose flu vaccine at [facility] in October 2015.       C. Tdap: She notes she believes she received it but is unable to specify the date. She notes she is still working at [employer] and believes she is UTD due to this.        D. Zostovax: Pt notes she previously had Shingles 4-5 years ago. Pt is curious if she requires the vaccine. 5.) Depression Screening Pt denies feeling down, depressed or hopeless. Negative PHQ2 per questioning.   6.) Fall Screening Pt denies having falls at home with no falls within one year. Pt denies having any new risk factors for falls.  7.) Vision Screening See results below. Pt notes she  has seen a opthalmologist about one month ago and notes her pressure was normal. She notes she is pre-cataracts. She also notes she took her vision screening exam with an old eyeglass prescription.   Visual Acuity Screening   Right eye Left eye Both eyes  Without correction:     With correction: 20/40 20/20 20/20    8.) Advanced Directives Pt notes she does have a living will. Pt states that she has discussed her wishes with her family and friends. Pt notes she would like to full-code.  9.) Exercise She notes she does exercise occasionally but denies having a regular routine.  10.) Dentist Pt has regular appointments with her dentist. Pt notes she has several crowns. She last saw her dentist in June.   Review of Systems  Constitutional: Negative for fever and chills.  Respiratory: Positive for shortness of breath.   Gastrointestinal: Negative for nausea and vomiting.     Objective:   Filed Vitals:   05/21/14 1338  BP: 147/73  Pulse: 74  Temp: 98.2 F (36.8 C)  TempSrc: Oral  Resp: 16  Height: 5\' 4"  (1.626 m)  Weight: 144 lb 6.4 oz (65.499 kg)  SpO2: 97%    Physical Exam  Constitutional: She is oriented to person, place, and time. She appears well-developed and well-nourished.  HENT:  Head: Normocephalic and atraumatic.  Eyes: Conjunctivae and EOM are normal. Pupils are equal, round, and reactive to light.  Neck: Carotid bruit is not present.  Cardiovascular: Normal rate, regular rhythm, normal heart sounds and intact distal pulses.   Pulmonary/Chest: Effort normal and breath sounds normal.  Abdominal: Soft. She exhibits no pulsatile midline mass. There is no tenderness.  Neurological: She is alert and oriented to person, place, and time.  Skin: Skin is warm and dry.  Psychiatric: She has a normal mood and affect. Her behavior is normal.  Vitals reviewed.   Assessment & Plan:  2:43 PM- Patient informed of current plan for treatment and evaluation and agrees with  plan at this time. Maria Olsen is a 73 y.o. female Annual physical exam  - -anticipatory guidance as below in AVS, screening labs above. Health maintenance items as above in HPI discussed/recommended as applicable.   Osteopenia, Disorder of bone and cartilage, Estrogen deficiency   - Plan: DG Bone Density- Plan: Vit D  25 hydroxy (rtn osteoporosis monitoring),  Chronic bronchitis, unspecified chronic bronchitis type - Plan: tiotropium (SPIRIVA HANDIHALER) 18 MCG inhalation capsule  - declined spirometry, but with more frequent albuterol use - start Spiriva.   Screening for hyperlipidemia - Plan: Lipid panel  Screening for diabetes mellitus - Plan: COMPLETE METABOLIC PANEL WITH GFR  Need for shingles vaccine - Plan: zoster vaccine live, PF, ([place]) 41324  UNT/0.65ML injection   - paper Rx given in case she would like to have this, can check into coverage/cost.  Need for prophylactic vaccination against Streptococcus pneumoniae (pneumococcus) - Plan: Pneumococcal conjugate vaccine 13-valent IM.  -Prevnar given today. Plan on Pneumovax 23 at follow up in 3 months.     Meds ordered this encounter  Medications  . tiotropium (SPIRIVA HANDIHALER) 18 MCG inhalation capsule    Sig: Place 1 capsule (18 mcg total) into inhaler and inhale daily.    Dispense:  30 capsule    Refill:  12  . zoster vaccine live, PF, (ZOSTAVAX) 00923 UNT/0.65ML injection    Sig: Inject 19,400 Units into the skin once.    Dispense:  1 each    Refill:  0   Patient Instructions  You should receive a call or letter about your lab results within the next week to 10 days.  Start Spiriva as discussed for COPD. Albuterol if needed.  WE will work on scheduling the bone density.   Plan on recheck in next 3 months for follow up on COPD. Return to the clinic or go to the nearest emergency room if any of your symptoms worsen or new symptoms occur.  Keeping You Healthy  Get These Tests  Blood Pressure- Have your  blood pressure checked by your healthcare provider at least once a year.  Normal blood pressure is 120/80.  Weight- Have your body mass index (BMI) calculated to screen for obesity.  BMI is a measure of body fat based on height and weight.  You can calculate your own BMI at [employer]  Cholesterol- Have your cholesterol checked every year.  Diabetes- Have your blood sugar checked every year if you have high blood pressure, high cholesterol, a family history of diabetes or if you are overweight.  Pap Smear- Have a pap smear every 1 to 3 years if you have been sexually active.  If you are older than 65 and recent pap smears have been normal you may not need additional pap smears.  In addition, if you have had a hysterectomy  For benign disease additional pap smears are not necessary.  Mammogram-Yearly mammograms are essential for early detection of breast cancer  Screening for Colon Cancer- Colonoscopy starting at age 28. Screening may begin sooner depending on your family history and other health conditions.  Follow up colonoscopy as directed by your Gastroenterologist.  Screening for Osteoporosis- Screening begins at age 22 with bone density scanning, sooner if you are at higher risk for developing Osteoporosis.  Get these medicines  Calcium with Vitamin D- Your body requires 1200-1500 mg of Calcium a day and [phone] IU of Vitamin D a day.  You can only absorb 500 mg of Calcium at a time therefore Calcium must be taken in 2 or 3 separate doses throughout the day.  Hormones- Hormone therapy has been associated with increased risk for certain cancers and heart disease.  Talk to your healthcare provider about if you need relief from menopausal symptoms.  Aspirin- Ask your healthcare provider about taking Aspirin to prevent Heart Disease and Stroke.  Get these Immuniztions  Flu shot- Every fall  Pneumonia shot- Once after the age of 8; if you are younger ask your healthcare  provider if you need a pneumonia shot.  Tetanus- Every ten years.  Zostavax- Once after the age of 2 to prevent shingles.  Take these steps  Don't smoke- Your healthcare provider can help you quit. For tips on how to quit,  ask your healthcare provider or go to www.smokefree.gov or call 1-800 QUIT-NOW.  Be physically active- Exercise 5 days a week for a minimum of 30 minutes.  If you are not already physically active, start slow and gradually work up to 30 minutes of moderate physical activity.  Try walking, dancing, bike riding, swimming, etc.  Eat a healthy diet- Eat a variety of healthy foods such as fruits, vegetables, whole grains, low fat milk, low fat cheeses, yogurt, lean meats, chicken, fish, eggs, dried beans, tofu, etc.  For more information go to www.thenutritionsource.org  Dental visit- Brush and floss teeth twice daily; visit your dentist twice a year.  Eye exam- Visit your Optometrist or Ophthalmologist yearly.  Drink alcohol in moderation- Limit alcohol intake to one drink or less a day.  Never drink and drive.  Depression- Your emotional health is as important as your physical health.  If you're feeling down or losing interest in things you normally enjoy, please talk to your healthcare provider.  Seat Belts- can save your life; always wear one  Smoke/Carbon Monoxide detectors- These detectors need to be installed on the appropriate level of your home.  Replace batteries at least once a year.  Violence- If anyone is threatening or hurting you, please tell your healthcare provider.  Living Will/ Health care power of attorney- Discuss with your healthcare provider and family.    I personally performed the services described in this documentation, which was scribed in my presence. The recorded information has been reviewed and considered, and addended by me as needed.

## 2014-05-21 NOTE — Patient Instructions (Signed)
You should receive a call or letter about your lab results within the next week to 10 days.  Start Spiriva as discussed for COPD. Albuterol if needed.  WE will work on scheduling the bone density.   Plan on recheck in next 3 months for follow up on COPD. Return to the clinic or go to the nearest emergency room if any of your symptoms worsen or new symptoms occur.  Keeping You Healthy  Get These Tests  Blood Pressure- Have your blood pressure checked by your healthcare provider at least once a year.  Normal blood pressure is 120/80.  Weight- Have your body mass index (BMI) calculated to screen for obesity.  BMI is a measure of body fat based on height and weight.  You can calculate your own BMI at [employer]  Cholesterol- Have your cholesterol checked every year.  Diabetes- Have your blood sugar checked every year if you have high blood pressure, high cholesterol, a family history of diabetes or if you are overweight.  Pap Smear- Have a pap smear every 1 to 3 years if you have been sexually active.  If you are older than 65 and recent pap smears have been normal you may not need additional pap smears.  In addition, if you have had a hysterectomy  For benign disease additional pap smears are not necessary.  Mammogram-Yearly mammograms are essential for early detection of breast cancer  Screening for Colon Cancer- Colonoscopy starting at age 25. Screening may begin sooner depending on your family history and other health conditions.  Follow up colonoscopy as directed by your Gastroenterologist.  Screening for Osteoporosis- Screening begins at age 11 with bone density scanning, sooner if you are at higher risk for developing Osteoporosis.  Get these medicines  Calcium with Vitamin D- Your body requires 1200-1500 mg of Calcium a day and [phone] IU of Vitamin D a day.  You can only absorb 500 mg of Calcium at a time therefore Calcium must be taken in 2 or 3 separate doses  throughout the day.  Hormones- Hormone therapy has been associated with increased risk for certain cancers and heart disease.  Talk to your healthcare provider about if you need relief from menopausal symptoms.  Aspirin- Ask your healthcare provider about taking Aspirin to prevent Heart Disease and Stroke.  Get these Immuniztions  Flu shot- Every fall  Pneumonia shot- Once after the age of 65; if you are younger ask your healthcare provider if you need a pneumonia shot.  Tetanus- Every ten years.  Zostavax- Once after the age of 35 to prevent shingles.  Take these steps  Don't smoke- Your healthcare provider can help you quit. For tips on how to quit, ask your healthcare provider or go to www.smokefree.gov or call 1-800 QUIT-NOW.  Be physically active- Exercise 5 days a week for a minimum of 30 minutes.  If you are not already physically active, start slow and gradually work up to 30 minutes of moderate physical activity.  Try walking, dancing, bike riding, swimming, etc.  Eat a healthy diet- Eat a variety of healthy foods such as fruits, vegetables, whole grains, low fat milk, low fat cheeses, yogurt, lean meats, chicken, fish, eggs, dried beans, tofu, etc.  For more information go to www.thenutritionsource.org  Dental visit- Brush and floss teeth twice daily; visit your dentist twice a year.  Eye exam- Visit your Optometrist or Ophthalmologist yearly.  Drink alcohol in moderation- Limit alcohol intake to one drink or less a day.  Never  drink and drive.  Depression- Your emotional health is as important as your physical health.  If you're feeling down or losing interest in things you normally enjoy, please talk to your healthcare provider.  Seat Belts- can save your life; always wear one  Smoke/Carbon Monoxide detectors- These detectors need to be installed on the appropriate level of your home.  Replace batteries at least once a year.  Violence- If anyone is threatening or  hurting you, please tell your healthcare provider.  Living Will/ Health care power of attorney- Discuss with your healthcare provider and family.

## 2014-05-22 LAB — VITAMIN D 25 HYDROXY (VIT D DEFICIENCY, FRACTURES): VIT D 25 HYDROXY: 27 ng/mL — AB (ref 30–100)

## 2014-08-06 ENCOUNTER — Ambulatory Visit (INDEPENDENT_AMBULATORY_CARE_PROVIDER_SITE_OTHER): Payer: Medicare Other | Admitting: Family Medicine

## 2014-08-06 VITALS — BP 140/76 | HR 83 | Temp 98.1°F | Resp 16 | Ht 63.75 in | Wt 148.4 lb

## 2014-08-06 DIAGNOSIS — J208 Acute bronchitis due to other specified organisms: Secondary | ICD-10-CM

## 2014-08-06 DIAGNOSIS — J441 Chronic obstructive pulmonary disease with (acute) exacerbation: Secondary | ICD-10-CM

## 2014-08-06 MED ORDER — PREDNISONE 20 MG PO TABS: ORAL_TABLET | ORAL | Status: DC

## 2014-08-06 MED ORDER — AMOXICILLIN-POT CLAVULANATE 875-125 MG PO TABS: 1.0000 | ORAL_TABLET | Freq: Two times a day (BID) | ORAL | Status: DC

## 2014-08-06 NOTE — Patient Instructions (Signed)
We are going to use prednisone and augmentin for your copd exacerbation/ bronchitis.  Use both as directed but let me know if you have trouble with the augmentin

## 2014-08-06 NOTE — Progress Notes (Signed)
[facility] [address], [place] [address] [phone]  Date:  08/06/2014   Name:  Maria Olsen   DOB:  March 11, 1941   MRN:  [id number]  PCP:  [name], MD    Chief Complaint: Sinus Congestion and Cough   History of Present Illness:  Maria Olsen is a 74 y.o. very pleasant female patient who presents with the following:  She is here today with possible bronchitis/ sinus infection.  She has noted sx for 3-4 weeks. Started with HA, malaise, feeling badly in general.  She feels better overall now but she is still coughing- her cough is productive and she is also getting thick green stuff from her nose.  She has not noted a fever as of late.   She is on spriva for COPD, and is using her albuterol as needed.  She has used the albuterol once a day or so recently; generally she does not need it at all. It does help a little bit when she uses it.   She does not have DM  She is allergic to doxycycline- it makes her vomit. She has history of GI side effects to several medications.   Patient Active Problem List   Diagnosis Date Noted  . HTN (hypertension) 06/30/2013    Past Medical History  Diagnosis Date  . Cancer   . Hypertension   . COPD (chronic obstructive pulmonary disease)     Past Surgical History  Procedure Laterality Date  . Breast surgery    . Cesarean section    . Neck surgery      History  Substance Use Topics  . Smoking status: Former Smoker    Types: Cigarettes  . Smokeless tobacco: Never Used  . Alcohol Use: Yes     Comment: rare    Family History  Problem Relation Age of Onset  . Kidney failure Father   . Melanoma Sister   . Cancer Brother     Allergies  Allergen Reactions  . Demerol [Meperidine] Nausea And Vomiting  . Doxycycline     Sick on the stomach  . Monocid [Cefonicid] Nausea And Vomiting    Medication list has been reviewed and updated.  Current Outpatient Prescriptions on File Prior to Visit   Medication Sig Dispense Refill  . albuterol (PROVENTIL HFA;VENTOLIN HFA) 108 (90 BASE) MCG/ACT inhaler Inhale 2 puffs into the lungs every 6 (six) hours as needed for wheezing or shortness of breath. 1 Inhaler 2  . tiotropium (SPIRIVA HANDIHALER) 18 MCG inhalation capsule Place 1 capsule (18 mcg total) into inhaler and inhale daily. 30 capsule 12  . zoster vaccine live, PF, (ZOSTAVAX) 82707 UNT/0.65ML injection Inject 19,400 Units into the skin once. (Patient not taking: Reported on 08/06/2014) 1 each 0   No current facility-administered medications on file prior to visit.    Review of Systems:  As per HPI- otherwise negative.   Physical Examination: Filed Vitals:   08/06/14 1510  BP: 140/76  Pulse: 83  Temp: 98.1 F (36.7 C)  Resp: 16   Filed Vitals:   08/06/14 1510  Height: 5' 3.75" (1.619 m)  Weight: 148 lb 6.4 oz (67.314 kg)   Body mass index is 25.68 kg/(m^2). Ideal Body Weight: Weight in (lb) to have BMI = 25: 144.2  GEN: WDWN, NAD, Non-toxic, A & O x 3, slim build, looks well and younger than stated age 67: Atraumatic, Normocephalic. Neck supple. No masses, No LAD. Ears and Nose: No external  deformity. CV: RRR, No M/G/R. No JVD. No thrill. No extra heart sounds. PULM: CTA B, minimal expiratory wheezes, no crackles or rhonchi. No retractions. No resp. distress. No accessory muscle use. EXTR: No c/c/e NEURO Normal gait.  PSYCH: Normally interactive. Conversant. Not depressed or anxious appearing.  Calm demeanor.    Assessment and Plan: COPD exacerbation - Plan: predniSONE (DELTASONE) 20 MG tablet, amoxicillin-clavulanate (AUGMENTIN) 875-125 MG per tablet  Acute bronchitis due to other specified organisms - Plan: predniSONE (DELTASONE) 20 MG tablet, amoxicillin-clavulanate (AUGMENTIN) 875-125 MG per tablet  Treat for a COPD exacerbation with prednisone and augmentin. She will let us know if not better in the next few days- Sooner if worse.     Signed Lamar Blinks, MD

## 2014-11-25 ENCOUNTER — Ambulatory Visit (INDEPENDENT_AMBULATORY_CARE_PROVIDER_SITE_OTHER): Payer: Medicare Other | Admitting: Emergency Medicine

## 2014-11-25 VITALS — BP 130/70 | HR 89 | Temp 98.1°F | Ht 64.0 in | Wt 145.2 lb

## 2014-11-25 DIAGNOSIS — J441 Chronic obstructive pulmonary disease with (acute) exacerbation: Secondary | ICD-10-CM | POA: Diagnosis not present

## 2014-11-25 MED ORDER — CLARITHROMYCIN 500 MG PO TABS: 500.0000 mg | ORAL_TABLET | Freq: Two times a day (BID) | ORAL | Status: DC

## 2014-11-25 MED ORDER — ALBUTEROL SULFATE HFA 108 (90 BASE) MCG/ACT IN AERS: 2.0000 | INHALATION_SPRAY | Freq: Four times a day (QID) | RESPIRATORY_TRACT | Status: DC | PRN

## 2014-11-25 MED ORDER — HYDROCOD POLST-CPM POLST ER 10-8 MG/5ML PO SUER: 5.0000 mL | Freq: Two times a day (BID) | ORAL | Status: DC

## 2014-11-25 NOTE — Patient Instructions (Signed)

## 2014-11-25 NOTE — Progress Notes (Signed)
Subjective:  Patient ID: Maria Olsen, female    DOB: 21-Jun-1941  Age: 74 y.o. MRN: [id number]  CC: Cough and Sinus Problem   HPI AKANE TESSIER presents for evaluation treatment of a cough. She has a history of COPD. She's been using Spiriva but has neglected to refill her albuterol inhaler. She has a cough productive of mucopurulent sputum associated with wheezing and shortness of breath in the morning. She has some nasal congestion and postnasal drainage that she expectorates and is largely mucoid. She has no nausea vomiting or stool change. No fever or chills. Has no stool change. No genitourinary symptoms.  She is a nonsmoker. She denies any improvement with over-the-counter medication and has no other acute medical complaints today  Outpatient Prescriptions Prior to Visit  Medication Sig Dispense Refill  . tiotropium (SPIRIVA HANDIHALER) 18 MCG inhalation capsule Place 1 capsule (18 mcg total) into inhaler and inhale daily. 30 capsule 12  . zoster vaccine live, PF, (ZOSTAVAX) 25638 UNT/0.65ML injection Inject 19,400 Units into the skin once. (Patient not taking: Reported on 08/06/2014) 1 each 0  . albuterol (PROVENTIL HFA;VENTOLIN HFA) 108 (90 BASE) MCG/ACT inhaler Inhale 2 puffs into the lungs every 6 (six) hours as needed for wheezing or shortness of breath. (Patient not taking: Reported on 11/25/2014) 1 Inhaler 2  . amoxicillin-clavulanate (AUGMENTIN) 875-125 MG per tablet Take 1 tablet by mouth 2 (two) times daily. 20 tablet 0  . predniSONE (DELTASONE) 20 MG tablet Take 2 pills a day for 3 days, then 1 pill a day for 3 days 9 tablet 0   No facility-administered medications prior to visit.    ROS Review of Systems  Constitutional: Negative for fever, chills and appetite change.  HENT: Negative for congestion, ear pain, postnasal drip, sinus pressure and sore throat.   Eyes: Negative for pain and redness.  Respiratory: Positive for cough, shortness of breath and wheezing.     Cardiovascular: Negative for leg swelling.  Gastrointestinal: Negative for nausea, vomiting, abdominal pain, diarrhea, constipation and blood in stool.  Endocrine: Negative for polyuria.  Genitourinary: Negative for dysuria, urgency, frequency and flank pain.  Musculoskeletal: Negative for gait problem.  Skin: Negative for rash.  Neurological: Negative for weakness and headaches.  Psychiatric/Behavioral: Negative for confusion and decreased concentration. The patient is not nervous/anxious.     Objective:  BP 130/70 mmHg  Pulse 89  Temp(Src) 98.1 F (36.7 C) (Oral)  Ht 5\' 4"  (1.626 m)  Wt 145 lb 4 oz (65.885 kg)  BMI 24.92 kg/m2  SpO2 93%  BP Readings from Last 3 Encounters:  11/25/14 130/70  08/06/14 140/76  05/21/14 147/73    Wt Readings from Last 3 Encounters:  11/25/14 145 lb 4 oz (65.885 kg)  08/06/14 148 lb 6.4 oz (67.314 kg)  05/21/14 144 lb 6.4 oz (65.499 kg)    Physical Exam  Constitutional: She is oriented to person, place, and time. She appears well-developed and well-nourished. No distress.  HENT:  Head: Normocephalic and atraumatic.  Right Ear: External ear normal.  Left Ear: External ear normal.  Nose: Nose normal.  Eyes: Conjunctivae and EOM are normal. Pupils are equal, round, and reactive to light. No scleral icterus.  Neck: Normal range of motion. Neck supple. No tracheal deviation present.  Cardiovascular: Normal rate, regular rhythm and normal heart sounds.   Pulmonary/Chest: Effort normal. No respiratory distress. She has no wheezes. She has no rales.  Abdominal: She exhibits no mass. There is no tenderness. There is  no rebound and no guarding.  Musculoskeletal: She exhibits no edema.  Lymphadenopathy:    She has no cervical adenopathy.  Neurological: She is alert and oriented to person, place, and time. Coordination normal.  Skin: Skin is warm and dry. No rash noted.  Psychiatric: She has a normal mood and affect. Her behavior is normal.     Lab Results  Component Value Date   WBC 9.8 06/30/2013   HGB 14.0 06/30/2013   HCT 46.3 06/30/2013   GLUCOSE 82 05/21/2014   CHOL 161 05/21/2014   TRIG 86 05/21/2014   HDL 63 05/21/2014   LDLCALC 81 05/21/2014   ALT 13 05/21/2014   AST 20 05/21/2014   NA 140 05/21/2014   K 4.1 05/21/2014   CL 101 05/21/2014   CREATININE 0.95 05/21/2014   BUN 14 05/21/2014   CO2 27 05/21/2014      Assessment & Plan:   Alfonso was seen today for cough and sinus problem.  Diagnoses and all orders for this visit:  COPD exacerbation  Other orders -     albuterol (PROVENTIL HFA;VENTOLIN HFA) 108 (90 BASE) MCG/ACT inhaler; Inhale 2 puffs into the lungs every 6 (six) hours as needed for wheezing or shortness of breath. -     clarithromycin (BIAXIN) 500 MG tablet; Take 1 tablet (500 mg total) by mouth 2 (two) times daily. -     chlorpheniramine-HYDROcodone (TUSSIONEX PENNKINETIC ER) 10-8 MG/5ML SUER; Take 5 mLs by mouth 2 (two) times daily.   I have discontinued Ms. Abe's predniSONE and amoxicillin-clavulanate. I am also having her start on clarithromycin and chlorpheniramine-HYDROcodone. Additionally, I am having her maintain her tiotropium, zoster vaccine live (PF), and albuterol.  Meds ordered this encounter  Medications  . albuterol (PROVENTIL HFA;VENTOLIN HFA) 108 (90 BASE) MCG/ACT inhaler    Sig: Inhale 2 puffs into the lungs every 6 (six) hours as needed for wheezing or shortness of breath.    Dispense:  1 Inhaler    Refill:  2  . clarithromycin (BIAXIN) 500 MG tablet    Sig: Take 1 tablet (500 mg total) by mouth 2 (two) times daily.    Dispense:  20 tablet    Refill:  0  . chlorpheniramine-HYDROcodone (TUSSIONEX PENNKINETIC ER) 10-8 MG/5ML SUER    Sig: Take 5 mLs by mouth 2 (two) times daily.    Dispense:  60 mL    Refill:  0   Appropriate red flags were discussed with her as or appropriate actions take should he encounter them.  Follow-up: Return if symptoms worsen or fail  to improve.  Roselee Culver, MD

## 2015-01-22 DIAGNOSIS — H2513 Age-related nuclear cataract, bilateral: Secondary | ICD-10-CM | POA: Diagnosis not present

## 2015-01-22 DIAGNOSIS — H40033 Anatomical narrow angle, bilateral: Secondary | ICD-10-CM | POA: Diagnosis not present

## 2015-06-25 ENCOUNTER — Other Ambulatory Visit: Payer: Self-pay | Admitting: Family Medicine

## 2015-09-11 ENCOUNTER — Other Ambulatory Visit: Payer: Self-pay

## 2015-09-11 ENCOUNTER — Telehealth: Payer: Self-pay

## 2015-09-11 ENCOUNTER — Other Ambulatory Visit: Payer: Self-pay | Admitting: Physician Assistant

## 2015-09-11 DIAGNOSIS — Z1231 Encounter for screening mammogram for malignant neoplasm of breast: Secondary | ICD-10-CM

## 2015-09-11 MED ORDER — TIOTROPIUM BROMIDE MONOHYDRATE 18 MCG IN CAPS: ORAL_CAPSULE | RESPIRATORY_TRACT | Status: DC

## 2015-09-11 NOTE — Telephone Encounter (Signed)
Done

## 2015-09-11 NOTE — Telephone Encounter (Signed)
Patient needs a refill for spiriva. She has an appointment on 4/12 with Dr. Carlota Raspberry and doesn't have enough to last until then. [facility] on [address]

## 2015-09-30 ENCOUNTER — Ambulatory Visit
Admission: RE | Admit: 2015-09-30 | Discharge: 2015-09-30 | Disposition: A | Discharge status: Home / Self Care | Payer: Medicare Other | Source: Ambulatory Visit

## 2015-09-30 DIAGNOSIS — Z1231 Encounter for screening mammogram for malignant neoplasm of breast: Secondary | ICD-10-CM

## 2015-10-01 ENCOUNTER — Telehealth: Payer: Self-pay | Admitting: Family Medicine

## 2015-10-01 NOTE — Telephone Encounter (Signed)
Left a message for patient to return call.  Need to find out where her last colonoscopy was done?

## 2015-10-16 ENCOUNTER — Ambulatory Visit (INDEPENDENT_AMBULATORY_CARE_PROVIDER_SITE_OTHER): Payer: Medicare Other | Admitting: Family Medicine

## 2015-10-16 ENCOUNTER — Encounter: Payer: Self-pay | Admitting: Family Medicine

## 2015-10-16 VITALS — BP 140/66 | HR 73 | Temp 97.7°F | Resp 16 | Ht 64.0 in | Wt 154.6 lb

## 2015-10-16 DIAGNOSIS — Z131 Encounter for screening for diabetes mellitus: Secondary | ICD-10-CM

## 2015-10-16 DIAGNOSIS — Z0000 Encounter for general adult medical examination without abnormal findings: Secondary | ICD-10-CM | POA: Diagnosis not present

## 2015-10-16 DIAGNOSIS — R7989 Other specified abnormal findings of blood chemistry: Secondary | ICD-10-CM

## 2015-10-16 DIAGNOSIS — Z23 Encounter for immunization: Secondary | ICD-10-CM

## 2015-10-16 DIAGNOSIS — J449 Chronic obstructive pulmonary disease, unspecified: Secondary | ICD-10-CM

## 2015-10-16 DIAGNOSIS — Z13820 Encounter for screening for osteoporosis: Secondary | ICD-10-CM | POA: Diagnosis not present

## 2015-10-16 DIAGNOSIS — M8580 Other specified disorders of bone density and structure, unspecified site: Secondary | ICD-10-CM

## 2015-10-16 DIAGNOSIS — Z13220 Encounter for screening for lipoid disorders: Secondary | ICD-10-CM

## 2015-10-16 LAB — COMPLETE METABOLIC PANEL WITH GFR
ALT: 12 U/L (ref 6–29)
AST: 16 U/L (ref 10–35)
Albumin: 4.1 g/dL (ref 3.6–5.1)
Alkaline Phosphatase: 62 U/L (ref 33–130)
BUN: 27 mg/dL — AB (ref 7–25)
CALCIUM: 8.8 mg/dL (ref 8.6–10.4)
CHLORIDE: 106 mmol/L (ref 98–110)
CO2: 27 mmol/L (ref 20–31)
Creat: 0.92 mg/dL (ref 0.60–0.93)
GFR, EST AFRICAN AMERICAN: 71 mL/min (ref >=60)
GFR, Est Non African American: 62 mL/min (ref >=60)
Glucose, Bld: 88 mg/dL (ref 65–99)
POTASSIUM: 4.2 mmol/L (ref 3.5–5.3)
Sodium: 141 mmol/L (ref 135–146)
Total Bilirubin: 0.5 mg/dL (ref 0.2–1.2)
Total Protein: 6.7 g/dL (ref 6.1–8.1)

## 2015-10-16 MED ORDER — TIOTROPIUM BROMIDE MONOHYDRATE 18 MCG IN CAPS: ORAL_CAPSULE | RESPIRATORY_TRACT | Status: DC

## 2015-10-16 NOTE — Progress Notes (Signed)
   Subjective:    Patient ID: Maria Olsen, female    DOB: Sep 07, 1940, 75 y.o.   MRN: [id number]  HPI    Review of Systems  Constitutional: Negative.   HENT: Positive for hearing loss.   Eyes: Negative.   Respiratory: Negative.   Cardiovascular: Negative.   Gastrointestinal: Negative.   Endocrine: Negative.   Genitourinary: Negative.   Musculoskeletal: Negative.   Skin: Negative.   Allergic/Immunologic: Negative.   Neurological: Negative.   Hematological: Negative.   Psychiatric/Behavioral: Negative.        Objective:   Physical Exam        Assessment & Plan:

## 2015-10-16 NOTE — Patient Instructions (Addendum)
IF you received an x-ray today, you will receive an invoice from [facility]. Please contact [facility] at [phone] with questions or concerns regarding your invoice.   IF you received labwork today, you will receive an invoice from [employer]. Please contact [facility] at [phone] with questions or concerns regarding your invoice.   Our billing staff will not be able to assist you with questions regarding bills from these companies.  You will be contacted with the lab results as soon as they are available. The fastest way to get your results is to activate your My Chart account. Instructions are located on the last page of this paperwork. If you have not heard from [place] regarding the results in 2 weeks, please contact this office.    I do recommend hearing testing.  This can be done at any audiologist, but let me know if you need a referral. Bring a copy of your living will to scan into system.   Pneumovax given today.   Continue vitamin d of at least 800 units per day and 1200-1500mg  calcium per day.   See information regarding incontinence, but if this worsens - return to discuss options.  Keeping You Healthy  Get These Tests  Blood Pressure- Have your blood pressure checked by your healthcare provider at least once a year.  Normal blood pressure is 120/80.  Weight- Have your body mass index (BMI) calculated to screen for obesity.  BMI is a measure of body fat based on height and weight.  You can calculate your own BMI at [employer].it  Cholesterol- Have your cholesterol checked every year.  Diabetes- Have your blood sugar checked every year if you have high blood pressure, high cholesterol, a family history of diabetes or if you are overweight.  Pap Test - Have a pap test every 1 to 5 years if you have been sexually active.  If you are older than 65 and recent pap tests have been normal you may not need additional  pap tests.  In addition, if you have had a hysterectomy  for benign disease additional pap tests are not necessary.  Mammogram-Yearly mammograms are essential for early detection of breast cancer  Screening for Colon Cancer- Colonoscopy starting at age 55. Screening may begin sooner depending on your family history and other health conditions.  Follow up colonoscopy as directed by your Gastroenterologist.  Screening for Osteoporosis- Screening begins at age 70 with bone density scanning, sooner if you are at higher risk for developing Osteoporosis.  Get these medicines  Calcium with Vitamin D- Your body requires 1200-1500 mg of Calcium a day and [phone] IU of Vitamin D a day.  You can only absorb 500 mg of Calcium at a time therefore Calcium must be taken in 2 or 3 separate doses throughout the day.  Hormones- Hormone therapy has been associated with increased risk for certain cancers and heart disease.  Talk to your healthcare provider about if you need relief from menopausal symptoms.  Aspirin- Ask your healthcare provider about taking Aspirin to prevent Heart Disease and Stroke.  Get these Immuniztions  Flu shot- Every fall  Pneumonia shot- Once after the age of 58; if you are younger ask your healthcare provider if you need a pneumonia shot.  Tetanus- Every ten years.  Zostavax- Once after the age of 64 to prevent shingles.  Take these steps  Don't smoke- Your healthcare provider can help you quit. For tips on how to quit, ask  your healthcare provider or go to www.smokefree.gov or call 1-800 QUIT-NOW.  Be physically active- Exercise 5 days a week for a minimum of 30 minutes.  If you are not already physically active, start slow and gradually work up to 30 minutes of moderate physical activity.  Try walking, dancing, bike riding, swimming, etc.  Eat a healthy diet- Eat a variety of healthy foods such as fruits, vegetables, whole grains, low fat milk, low fat cheeses, yogurt, lean  meats, chicken, fish, eggs, dried beans, tofu, etc.  For more information go to www.thenutritionsource.org  Dental visit- Brush and floss teeth twice daily; visit your dentist twice a year.  Eye exam- Visit your Optometrist or Ophthalmologist yearly.  Drink alcohol in moderation- Limit alcohol intake to one drink or less a day.  Never drink and drive.  Depression- Your emotional health is as important as your physical health.  If you're feeling down or losing interest in things you normally enjoy, please talk to your healthcare provider.  Seat Belts- can save your life; always wear one  Smoke/Carbon Monoxide detectors- These detectors need to be installed on the appropriate level of your home.  Replace batteries at least once a year.  Violence- If anyone is threatening or hurting you, please tell your healthcare provider.  Living Will/ Health care power of attorney- Discuss with your healthcare provider and family.   Urinary Incontinence Urinary incontinence is the involuntary loss of urine from your bladder. CAUSES  There are many causes of urinary incontinence. They include:  Medicines.  Infections.  Prostatic enlargement, leading to overflow of urine from your bladder.  Surgery.  Neurological diseases.  Emotional factors. SIGNS AND SYMPTOMS Urinary Incontinence can be divided into four types: 4. Urge incontinence. Urge incontinence is the involuntary loss of urine before you have the opportunity to go to the bathroom. There is a sudden urge to void but not enough time to reach a bathroom. 5. Stress incontinence. Stress incontinence is the sudden loss of urine with any activity that forces urine to pass. It is commonly caused by anatomical changes to the pelvis and sphincter areas of your body. 6. Overflow incontinence. Overflow incontinence is the loss of urine from an obstructed opening to your bladder. This results in a backup of urine and a resultant buildup of pressure  within the bladder. When the pressure within the bladder exceeds the closing pressure of the sphincter, the urine overflows, which causes incontinence, similar to water overflowing a dam. 7. Total incontinence. Total incontinence is the loss of urine as a result of the inability to store urine within your bladder. DIAGNOSIS  Evaluating the cause of incontinence may require:  A thorough and complete medical and obstetric history.  A complete physical exam.  Laboratory tests such as a urine culture and sensitivities. When additional tests are indicated, they can include:  An ultrasound exam.  Kidney and bladder X-rays.  Cystoscopy. This is an exam of the bladder using a narrow scope.  Urodynamic testing to test the nerve function to the bladder and sphincter areas. TREATMENT  Treatment for urinary incontinence depends on the cause:  For urge incontinence caused by a bacterial infection, antibiotics will be prescribed. If the urge incontinence is related to medicines you take, your health care provider may have you change the medicine.  For stress incontinence, surgery to re-establish anatomical support to the bladder or sphincter, or both, will often correct the condition.  For overflow incontinence caused by an enlarged prostate, an operation to  open the channel through the enlarged prostate will allow the flow of urine out of the bladder. In women with fibroids, a hysterectomy may be recommended.  For total incontinence, surgery on your urinary sphincter may help. An artificial urinary sphincter (an inflatable cuff placed around the urethra) may be required. In women who have developed a hole-like passage between their bladder and vagina (vesicovaginal fistula), surgery to close the fistula often is required. HOME CARE INSTRUCTIONS  Normal daily hygiene and the use of pads or adult diapers that are changed regularly will help prevent odors and skin damage.  Avoid caffeine. It can  overstimulate your bladder.  Use the bathroom regularly. Try about every 2-3 hours to go to the bathroom, even if you do not feel the need to do so. Take time to empty your bladder completely. After urinating, wait a minute. Then try to urinate again.  For causes involving nerve dysfunction, keep a log of the medicines you take and a journal of the times you go to the bathroom. SEEK MEDICAL CARE IF:  You experience worsening of pain instead of improvement in pain after your procedure.  Your incontinence becomes worse instead of better. SEE IMMEDIATE MEDICAL CARE IF:  You experience fever or shaking chills.  You are unable to pass your urine.  You have redness spreading into your groin or down into your thighs. MAKE SURE YOU:   Understand these instructions.   Will watch your condition.  Will get help right away if you are not doing well or get worse.   This information is not intended to replace advice given to you by your health care provider. Make sure you discuss any questions you have with your health care provider.   Document Released: 07/30/2004 Document Revised: 07/13/2014 Document Reviewed: 11/29/2012 Elsevier Interactive Patient Education [employer].

## 2015-10-16 NOTE — Progress Notes (Signed)
Subjective:  By signing my name below, I, Moises Blood, attest that this documentation has been prepared under the direction and in the presence of Merri Ray, MD. Electronically Signed: Moises Blood, [place]. 10/16/2015 , 3:23 PM .  Patient was seen in Room 21 .   Patient ID: Maria Olsen, female    DOB: 1940-12-12, 75 y.o.   MRN: [id number] Chief Complaint  Patient presents with  . Annual Exam  . Medication Refill   HPI Maria Olsen is a 75 y.o. female Here for complete annual physical. Last seen by me for a physical in Nov 201. Seen twice since for copd exacerbation. H/o copd and osteopenia with low vitamin d. She had fasting labwork drawn this morning.   COPD She was still smoking at last physical in 2015 but was a non smoker as of May 2016. She takes spiriva qd and albuterol as needed. She denies any flares recently. She denies using her albuterol often.   Osteopenia with low vitamin D Low vitamin D at 27 in 2015. OTC vitamin D 800 units a day was recommended, and 1200-1500mg  calcium a day with recheck levels in 3 months; no recheck was done yet. Bone density had been ordered previously but not performed. Last bone density was in 2009, T-score of -2.3. She has been treated with fosamax in the past for 5 years; also treated with nasal spray in the past.   She's been taking 1500 calcium qd and vitamin D 800 units.   Cancer Screening Colonoscopy: plan for repeat in 2017, as last one in 2012, repeat in 5 years due to polyps.  Cervical cancer screening: had a pap after 65 which was normal, declined this at last physical. She notes having some urinary overflow incontinence. She denies any other urinary symptoms.  H/o Breast Cancer, screening: mammogram screening in march 2015 and also in march 2017 without any concerns. She had a left mastectomy.   Immunizations Immunization History  Administered Date(s) Administered  . Influenza Split 06/08/2012  . Pneumococcal Conjugate-13  05/21/2014   She has not received pneumovax yet, and will receive it today.  Shingles vaccine was printed for her during last visit. She didn't receive the vaccine. She had shingles 7 years ago.   Depression Screening Depression screen [facility] 2/9 10/16/2015 11/25/2014 05/21/2014  Decreased Interest 0 0 0  Down, Depressed, Hopeless 0 0 0  PHQ - 2 Score 0 0 0    Fall Screening She denies falls in the past year.   Functional Status Survey She does admit to some hearing loss, but no other positive responses. She hears better with her left ear than her right ear.   Vision  Visual Acuity Screening   Right eye Left eye Both eyes  Without correction:     With correction: 20/20 20/13 20/13    She plans to see her eye doctor in July, who she sees annually.   Dentist She had her teeth cleaned yesterday; sees them every 6 months. She has several crowns in place.   Exercise She denies regular exercise lately.   Advanced Directives She has a living will, but hasn't document with [place] yet.   Patient Active Problem List   Diagnosis Date Noted  . HTN (hypertension) 06/30/2013   Past Medical History  Diagnosis Date  . Cancer ([place])   . Hypertension   . COPD (chronic obstructive pulmonary disease) [facility])    Past Surgical History  Procedure Laterality Date  . Breast surgery    .  Cesarean section    . Neck surgery    . Tubal ligation     Allergies  Allergen Reactions  . Demerol [Meperidine] Nausea And Vomiting  . Doxycycline     Sick on the stomach  . Monocid [Cefonicid] Nausea And Vomiting   Prior to Admission medications   Medication Sig Start Date End Date Taking? Authorizing Provider  albuterol (PROVENTIL HFA;VENTOLIN HFA) 108 (90 BASE) MCG/ACT inhaler Inhale 2 puffs into the lungs every 6 (six) hours as needed for wheezing or shortness of breath. 11/25/14   Roselee Culver, MD  chlorpheniramine-HYDROcodone (TUSSIONEX PENNKINETIC ER) 10-8 MG/5ML SUER Take 5 mLs by mouth 2 (two)  times daily. 11/25/14   Roselee Culver, MD  clarithromycin (BIAXIN) 500 MG tablet Take 1 tablet (500 mg total) by mouth 2 (two) times daily. 11/25/14   Roselee Culver, MD  tiotropium (SPIRIVA HANDIHALER) 18 MCG inhalation capsule INHALE ONE DOSE BY MOUTH ONCE DAILY 09/11/15   Mancel Bale, PA-C  zoster vaccine live, PF, (ZOSTAVAX) 09811 UNT/0.65ML injection Inject 19,400 Units into the skin once. Patient not taking: Reported on 08/06/2014 05/21/14   [name], MD   Social History   Social History  . Marital Status: Married    Spouse Name: N/A  . Number of Children: N/A  . Years of Education: N/A   Occupational History  . RN    Social History Main Topics  . Smoking status: Former Smoker    Types: Cigarettes  . Smokeless tobacco: Never Used  . Alcohol Use: No     Comment: rare  . Drug Use: No  . Sexual Activity: Not on file   Other Topics Concern  . Not on file   Social History Narrative   Married   Education: College   Exercise: some   Review of Systems 13 point review of systems per patient health survey noted.  Negative other than as indicated on reviewed nursing note.       Objective:   Physical Exam  Constitutional: She is oriented to person, place, and time. She appears well-developed and well-nourished.  HENT:  Head: Normocephalic and atraumatic.  Right Ear: External ear normal.  Left Ear: Tympanic membrane, external ear and ear canal normal.  Mouth/Throat: Oropharynx is clear and moist.  Moderate cerumen proximal canal, non obstructed  Eyes: Conjunctivae are normal. Pupils are equal, round, and reactive to light.  Neck: Normal range of motion. Neck supple. No thyromegaly present.  Cardiovascular: Normal rate, regular rhythm, normal heart sounds and intact distal pulses.   No murmur heard. Pulmonary/Chest: Effort normal and breath sounds normal. No respiratory distress. She has no wheezes.  Abdominal: Soft. Bowel sounds are normal. There is no  tenderness.  Musculoskeletal: Normal range of motion. She exhibits no edema or tenderness.  Lymphadenopathy:    She has no cervical adenopathy.  Neurological: She is alert and oriented to person, place, and time.  Skin: Skin is warm and dry. No rash noted.  Psychiatric: She has a normal mood and affect. Her behavior is normal. Thought content normal.  Vitals reviewed.    Filed Vitals:   10/16/15 1426  BP: 140/66  Pulse: 73  Temp: 97.7 F (36.5 C)  TempSrc: Oral  Resp: 16  Height: 5\' 4"  (1.626 m)  Weight: 154 lb 9.6 oz (70.126 kg)  SpO2: 98%      Assessment & Plan:   PRABHLEEN TISH is a 75 y.o. female Medicare annual wellness visit, subsequent  - -  anticipatory guidance as below in AVS, screening labs above. Health maintenance items as above in HPI discussed/recommended as applicable.   Screening for diabetes mellitus- CMP  Osteopenia Low serum vitamin D  - repeat bone density testing declined. Continued on calcium and vitamin D supplementation. Has completed 5 years of treatment with bisphosphonate previously.  Chronic obstructive pulmonary disease, unspecified COPD type ([place]) - Plan: tiotropium (SPIRIVA HANDIHALER) 18 MCG inhalation capsule  -Continue Spiriva. Controlled. Commended on tobacco cessation.  Need for prophylactic vaccination against Streptococcus pneumoniae (pneumococcus) - Plan: Pneumococcal polysaccharide vaccine 23-valent greater than or equal to 2yo subcutaneous/IM  -Pneumovax given. Up-to-date now on pneumonia vaccination.  Screening for hyperlipidemia - Plan: COMPLETE METABOLIC PANEL WITH GFR  Encounter for screening for osteoporosis  -As above. Vitamin D testing was not covered so deferred at this time.  Hearing difficulty, intermittent. May be age-related presbycusis, but recommended hearing testing if notices change.  Intermittent incontinence, overflow type by history. Handout given, RTC if worsening of symptoms.  Meds ordered this encounter    Medications  . tiotropium (SPIRIVA HANDIHALER) 18 MCG inhalation capsule    Sig: INHALE ONE DOSE BY MOUTH ONCE DAILY    Dispense:  30 capsule    Refill:  11   Patient Instructions       IF you received an x-ray today, you will receive an invoice from [facility]. Please contact [facility] at [phone] with questions or concerns regarding your invoice.   IF you received labwork today, you will receive an invoice from [employer]. Please contact [facility] at [phone] with questions or concerns regarding your invoice.   Our billing staff will not be able to assist you with questions regarding bills from these companies.  You will be contacted with the lab results as soon as they are available. The fastest way to get your results is to activate your My Chart account. Instructions are located on the last page of this paperwork. If you have not heard from [place] regarding the results in 2 weeks, please contact this office.    I do recommend hearing testing.  This can be done at any audiologist, but let me know if you need a referral. Bring a copy of your living will to scan into system.   Pneumovax given today.   Continue vitamin d of at least 800 units per day and 1200-1500mg  calcium per day.   See information regarding incontinence, but if this worsens - return to discuss options.  Keeping You Healthy  Get These Tests  Blood Pressure- Have your blood pressure checked by your healthcare provider at least once a year.  Normal blood pressure is 120/80.  Weight- Have your body mass index (BMI) calculated to screen for obesity.  BMI is a measure of body fat based on height and weight.  You can calculate your own BMI at [employer]  Cholesterol- Have your cholesterol checked every year.  Diabetes- Have your blood sugar checked every year if you have high blood pressure, high cholesterol, a family history of diabetes or if you  are overweight.  Pap Test - Have a pap test every 1 to 5 years if you have been sexually active.  If you are older than 65 and recent pap tests have been normal you may not need additional pap tests.  In addition, if you have had a hysterectomy  for benign disease additional pap tests are not necessary.  Mammogram-Yearly mammograms are essential for early detection of breast cancer  Screening for Colon Cancer- Colonoscopy starting at age 39. Screening may begin sooner depending on your family history and other health conditions.  Follow up colonoscopy as directed by your Gastroenterologist.  Screening for Osteoporosis- Screening begins at age 49 with bone density scanning, sooner if you are at higher risk for developing Osteoporosis.  Get these medicines  Calcium with Vitamin D- Your body requires 1200-1500 mg of Calcium a day and [phone] IU of Vitamin D a day.  You can only absorb 500 mg of Calcium at a time therefore Calcium must be taken in 2 or 3 separate doses throughout the day.  Hormones- Hormone therapy has been associated with increased risk for certain cancers and heart disease.  Talk to your healthcare provider about if you need relief from menopausal symptoms.  Aspirin- Ask your healthcare provider about taking Aspirin to prevent Heart Disease and Stroke.  Get these Immuniztions  Flu shot- Every fall  Pneumonia shot- Once after the age of 21; if you are younger ask your healthcare provider if you need a pneumonia shot.  Tetanus- Every ten years.  Zostavax- Once after the age of 22 to prevent shingles.  Take these steps  Don't smoke- Your healthcare provider can help you quit. For tips on how to quit, ask your healthcare provider or go to www.smokefree.gov or call 1-800 QUIT-NOW.  Be physically active- Exercise 5 days a week for a minimum of 30 minutes.  If you are not already physically active, start slow and gradually work up to 30 minutes of moderate physical activity.   Try walking, dancing, bike riding, swimming, etc.  Eat a healthy diet- Eat a variety of healthy foods such as fruits, vegetables, whole grains, low fat milk, low fat cheeses, yogurt, lean meats, chicken, fish, eggs, dried beans, tofu, etc.  For more information go to www.thenutritionsource.org  Dental visit- Brush and floss teeth twice daily; visit your dentist twice a year.  Eye exam- Visit your Optometrist or Ophthalmologist yearly.  Drink alcohol in moderation- Limit alcohol intake to one drink or less a day.  Never drink and drive.  Depression- Your emotional health is as important as your physical health.  If you're feeling down or losing interest in things you normally enjoy, please talk to your healthcare provider.  Seat Belts- can save your life; always wear one  Smoke/Carbon Monoxide detectors- These detectors need to be installed on the appropriate level of your home.  Replace batteries at least once a year.  Violence- If anyone is threatening or hurting you, please tell your healthcare provider.  Living Will/ Health care power of attorney- Discuss with your healthcare provider and family.   Urinary Incontinence Urinary incontinence is the involuntary loss of urine from your bladder. CAUSES  There are many causes of urinary incontinence. They include:  Medicines.  Infections.  Prostatic enlargement, leading to overflow of urine from your bladder.  Surgery.  Neurological diseases.  Emotional factors. SIGNS AND SYMPTOMS Urinary Incontinence can be divided into four types: 4. Urge incontinence. Urge incontinence is the involuntary loss of urine before you have the opportunity to go to the bathroom. There is a sudden urge to void but not enough time to reach a bathroom. 5. Stress incontinence. Stress incontinence is the sudden loss of urine with any activity that forces urine to pass. It is commonly caused by anatomical changes to the pelvis and sphincter areas of your  body. 6. Overflow incontinence. Overflow incontinence is the loss of urine from an obstructed  opening to your bladder. This results in a backup of urine and a resultant buildup of pressure within the bladder. When the pressure within the bladder exceeds the closing pressure of the sphincter, the urine overflows, which causes incontinence, similar to water overflowing a dam. 7. Total incontinence. Total incontinence is the loss of urine as a result of the inability to store urine within your bladder. DIAGNOSIS  Evaluating the cause of incontinence may require:  A thorough and complete medical and obstetric history.  A complete physical exam.  Laboratory tests such as a urine culture and sensitivities. When additional tests are indicated, they can include:  An ultrasound exam.  Kidney and bladder X-rays.  Cystoscopy. This is an exam of the bladder using a narrow scope.  Urodynamic testing to test the nerve function to the bladder and sphincter areas. TREATMENT  Treatment for urinary incontinence depends on the cause:  For urge incontinence caused by a bacterial infection, antibiotics will be prescribed. If the urge incontinence is related to medicines you take, your health care provider may have you change the medicine.  For stress incontinence, surgery to re-establish anatomical support to the bladder or sphincter, or both, will often correct the condition.  For overflow incontinence caused by an enlarged prostate, an operation to open the channel through the enlarged prostate will allow the flow of urine out of the bladder. In women with fibroids, a hysterectomy may be recommended.  For total incontinence, surgery on your urinary sphincter may help. An artificial urinary sphincter (an inflatable cuff placed around the urethra) may be required. In women who have developed a hole-like passage between their bladder and vagina (vesicovaginal fistula), surgery to close the fistula often is  required. HOME CARE INSTRUCTIONS  Normal daily hygiene and the use of pads or adult diapers that are changed regularly will help prevent odors and skin damage.  Avoid caffeine. It can overstimulate your bladder.  Use the bathroom regularly. Try about every 2-3 hours to go to the bathroom, even if you do not feel the need to do so. Take time to empty your bladder completely. After urinating, wait a minute. Then try to urinate again.  For causes involving nerve dysfunction, keep a log of the medicines you take and a journal of the times you go to the bathroom. SEEK MEDICAL CARE IF:  You experience worsening of pain instead of improvement in pain after your procedure.  Your incontinence becomes worse instead of better. SEE IMMEDIATE MEDICAL CARE IF:  You experience fever or shaking chills.  You are unable to pass your urine.  You have redness spreading into your groin or down into your thighs. MAKE SURE YOU:   Understand these instructions.   Will watch your condition.  Will get help right away if you are not doing well or get worse.   This information is not intended to replace advice given to you by your health care provider. Make sure you discuss any questions you have with your health care provider.   Document Released: 07/30/2004 Document Revised: 07/13/2014 Document Reviewed: 11/29/2012 Elsevier Interactive Patient Education [employer].     I personally performed the services described in this documentation, which was scribed in my presence. The recorded information has been reviewed and considered, and addended by me as needed.

## 2015-10-27 ENCOUNTER — Encounter: Payer: Self-pay | Admitting: *Deleted

## 2015-11-30 ENCOUNTER — Ambulatory Visit (INDEPENDENT_AMBULATORY_CARE_PROVIDER_SITE_OTHER): Payer: Medicare Other | Admitting: Family Medicine

## 2015-11-30 VITALS — BP 120/70 | HR 73 | Temp 97.8°F | Resp 18 | Ht 64.0 in | Wt 149.2 lb

## 2015-11-30 DIAGNOSIS — R51 Headache: Secondary | ICD-10-CM | POA: Diagnosis not present

## 2015-11-30 DIAGNOSIS — R519 Headache, unspecified: Secondary | ICD-10-CM

## 2015-11-30 DIAGNOSIS — J441 Chronic obstructive pulmonary disease with (acute) exacerbation: Secondary | ICD-10-CM

## 2015-11-30 MED ORDER — ALBUTEROL SULFATE HFA 108 (90 BASE) MCG/ACT IN AERS: 1.0000 | INHALATION_SPRAY | RESPIRATORY_TRACT | Status: DC | PRN

## 2015-11-30 MED ORDER — AZITHROMYCIN 250 MG PO TABS: ORAL_TABLET | ORAL | Status: DC

## 2015-11-30 NOTE — Patient Instructions (Addendum)
IF you received an x-ray today, you will receive an invoice from [facility]. Please contact [facility] at [phone] with questions or concerns regarding your invoice.   IF you received labwork today, you will receive an invoice from [employer]. Please contact [facility] at [phone] with questions or concerns regarding your invoice.   Our billing staff will not be able to assist you with questions regarding bills from these companies.  You will be contacted with the lab results as soon as they are available. The fastest way to get your results is to activate your My Chart account. Instructions are located on the last page of this paperwork. If you have not heard from [place] regarding the results in 2 weeks, please contact this office.    I suspect you have a COPD exacerbation. Start Z-Pak, albuterol if needed for wheezing, Mucinex for cough. If you require albuterol more than 3-4 times per day or persistently need this in the next 3-4 days, return for recheck as we may need to use prednisone at that point. Return sooner if short of breath or fevers.  Chronic Obstructive Pulmonary Disease Chronic obstructive pulmonary disease (COPD) is a common lung condition in which airflow from the lungs is limited. COPD is a general term that can be used to describe many different lung problems that limit airflow, including both chronic bronchitis and emphysema. If you have COPD, your lung function will probably never return to normal, but there are measures you can take to improve lung function and make yourself feel better. CAUSES   Smoking (common).  Exposure to secondhand smoke.  Genetic problems.  Chronic inflammatory lung diseases or recurrent infections. SYMPTOMS  Shortness of breath, especially with physical activity.  Deep, persistent (chronic) cough with a large amount of thick mucus.  Wheezing.  Rapid breaths (tachypnea).  Gray or  bluish discoloration (cyanosis) of the skin, especially in your fingers, toes, or lips.  Fatigue.  Weight loss.  Frequent infections or episodes when breathing symptoms become much worse (exacerbations).  Chest tightness. DIAGNOSIS Your health care provider will take a medical history and perform a physical examination to diagnose COPD. Additional tests for COPD may include:  Lung (pulmonary) function tests.  Chest X-ray.  CT scan.  Blood tests. TREATMENT  Treatment for COPD may include:  Inhaler and nebulizer medicines. These help manage the symptoms of COPD and make your breathing more comfortable.  Supplemental oxygen. Supplemental oxygen is only helpful if you have a low oxygen level in your blood.  Exercise and physical activity. These are beneficial for nearly all people with COPD.  Lung surgery or transplant.  Nutrition therapy to gain weight, if you are underweight.  Pulmonary rehabilitation. This may involve working with a team of health care providers and specialists, such as respiratory, occupational, and physical therapists. HOME CARE INSTRUCTIONS  Take all medicines (inhaled or pills) as directed by your health care provider.  Avoid over-the-counter medicines or cough syrups that dry up your airway (such as antihistamines) and slow down the elimination of secretions unless instructed otherwise by your health care provider.  If you are a smoker, the most important thing that you can do is stop smoking. Continuing to smoke will cause further lung damage and breathing trouble. Ask your health care provider for help with quitting smoking. He or she can direct you to community resources or hospitals that provide support.  Avoid exposure to irritants such as smoke, chemicals, and fumes that aggravate  your breathing.  Use oxygen therapy and pulmonary rehabilitation if directed by your health care provider. If you require home oxygen therapy, ask your health care  provider whether you should purchase a pulse oximeter to measure your oxygen level at home.  Avoid contact with individuals who have a contagious illness.  Avoid extreme temperature and humidity changes.  Eat healthy foods. Eating smaller, more frequent meals and resting before meals may help you maintain your strength.  Stay active, but balance activity with periods of rest. Exercise and physical activity will help you maintain your ability to do things you want to do.  Preventing infection and hospitalization is very important when you have COPD. Make sure to receive all the vaccines your health care provider recommends, especially the pneumococcal and influenza vaccines. Ask your health care provider whether you need a pneumonia vaccine.  Learn and use relaxation techniques to manage stress.  Learn and use controlled breathing techniques as directed by your health care provider. Controlled breathing techniques include:  Pursed lip breathing. Start by breathing in (inhaling) through your nose for 1 second. Then, purse your lips as if you were going to whistle and breathe out (exhale) through the pursed lips for 2 seconds.  Diaphragmatic breathing. Start by putting one hand on your abdomen just above your waist. Inhale slowly through your nose. The hand on your abdomen should move out. Then purse your lips and exhale slowly. You should be able to feel the hand on your abdomen moving in as you exhale.  Learn and use controlled coughing to clear mucus from your lungs. Controlled coughing is a series of short, progressive coughs. The steps of controlled coughing are: 1. Lean your head slightly forward. 2. Breathe in deeply using diaphragmatic breathing. 3. Try to hold your breath for 3 seconds. 4. Keep your mouth slightly open while coughing twice. 5. Spit any mucus out into a tissue. 6. Rest and repeat the steps once or twice as needed. SEEK MEDICAL CARE IF:  You are coughing up more  mucus than usual.  There is a change in the color or thickness of your mucus.  Your breathing is more labored than usual.  Your breathing is faster than usual. SEEK IMMEDIATE MEDICAL CARE IF:  You have shortness of breath while you are resting.  You have shortness of breath that prevents you from:  Being able to talk.  Performing your usual physical activities.  You have chest pain lasting longer than 5 minutes.  Your skin color is more cyanotic than usual.  You measure low oxygen saturations for longer than 5 minutes with a pulse oximeter. MAKE SURE YOU:  Understand these instructions.  Will watch your condition.  Will get help right away if you are not doing well or get worse.   This information is not intended to replace advice given to you by your health care provider. Make sure you discuss any questions you have with your health care provider.   Document Released: 04/01/2005 Document Revised: 07/13/2014 Document Reviewed: 02/16/2013 Elsevier Interactive Patient Education [employer].

## 2015-11-30 NOTE — Progress Notes (Signed)
By signing my name below I, Tereasa Coop, attest that this documentation has been prepared under the direction and in the presence of [name], MD. Electonically Signed. Tereasa Coop, Scribe 11/30/2015 at 2:21 PM  Subjective:    Patient ID: Maria Olsen, female    DOB: Jun 28, 1941, 75 y.o.   MRN: [id number]  Chief Complaint  Patient presents with  . Cough    prod-started Thursday  . Headache    HPI Maria Olsen is a 75 y.o. female who presents to the [facility] complaining of productive cough with green phlegm that started 2 days ago. Pt has been using her albuterol inhaler, cough drops, and robitussin. Pt had a fever 2 days ago. Pt has HA with cough and has developed sore throat. Pt has been taking tylenol for HA with some relief. Pt denies any changes in vision, weakness, or numbness. Pt's grandson c/o feeling sick and having cough.  Pt has history of tobacco abuse and COPD. Pt quit smoking May of 2016. Pt has albuterol inhaler to use as needed and uses spiriva QD.  Pt has been taking care of her husband who has early onset dementia and recently fell and broke his hip.     Patient Active Problem List   Diagnosis Date Noted  . HTN (hypertension) 06/30/2013   Past Medical History  Diagnosis Date  . Cancer ([place])   . Hypertension   . COPD (chronic obstructive pulmonary disease) [facility])    Past Surgical History  Procedure Laterality Date  . Breast surgery    . Cesarean section    . Neck surgery    . Tubal ligation     Allergies  Allergen Reactions  . Demerol [Meperidine] Nausea And Vomiting  . Doxycycline     Sick on the stomach  . Monocid [Cefonicid] Nausea And Vomiting   Prior to Admission medications   Medication Sig Start Date End Date Taking? Authorizing Provider  albuterol (PROVENTIL HFA;VENTOLIN HFA) 108 (90 BASE) MCG/ACT inhaler Inhale 2 puffs into the lungs every 6 (six) hours as needed for wheezing or shortness of breath. 11/25/14   Yes Roselee Culver, MD  calcium-vitamin D (OSCAL-500) 500-400 MG-UNIT tablet Take 1 tablet by mouth daily.   Yes Historical Provider, MD  Multiple Vitamins-Minerals (MULTIVITAMIN ADULT PO) Take by mouth.   Yes Historical Provider, MD  tiotropium (SPIRIVA HANDIHALER) 18 MCG inhalation capsule INHALE ONE DOSE BY MOUTH ONCE DAILY 10/16/15  Yes [name], MD  zoster vaccine live, PF, (ZOSTAVAX) 82956 UNT/0.65ML injection Inject 19,400 Units into the skin once. Patient not taking: Reported on 08/06/2014 05/21/14   [name], MD   Social History   Social History  . Marital Status: Married    Spouse Name: N/A  . Number of Children: N/A  . Years of Education: N/A   Occupational History  . RN    Social History Main Topics  . Smoking status: Former Smoker    Types: Cigarettes  . Smokeless tobacco: Never Used  . Alcohol Use: No     Comment: rare  . Drug Use: No  . Sexual Activity: Not on file   Other Topics Concern  . Not on file   Social History Narrative   Married   Education: College   Exercise: some      Review of Systems  Constitutional: Positive for fever.  HENT: Positive for sore throat.   Respiratory: Positive for cough.   Neurological: Positive for  headaches. Negative for weakness and numbness.       Objective:   Physical Exam  Constitutional: She is oriented to person, place, and time. She appears well-developed and well-nourished. No distress.  HENT:  Head: Normocephalic and atraumatic.  Right Ear: Hearing, tympanic membrane, external ear and ear canal normal.  Left Ear: Hearing, tympanic membrane, external ear and ear canal normal.  Nose: Nose normal. No sinus tenderness. Right sinus exhibits no maxillary sinus tenderness and no frontal sinus tenderness. Left sinus exhibits no maxillary sinus tenderness and no frontal sinus tenderness.  Mouth/Throat: Oropharynx is clear and moist. No oropharyngeal exudate.  Eyes: Conjunctivae and EOM are normal.  Pupils are equal, round, and reactive to light.  Cardiovascular: Normal rate, regular rhythm, normal heart sounds and intact distal pulses.   No murmur heard. Pulmonary/Chest: Effort normal. No respiratory distress. She has no wheezes. She has no rhonchi.  Pt has faint course breath sounds bilat.  Lymphadenopathy:    She has no cervical adenopathy.  Neurological: She is alert and oriented to person, place, and time.  Skin: Skin is warm and dry. No rash noted.  Psychiatric: She has a normal mood and affect. Her behavior is normal.  Vitals reviewed.    Filed Vitals:   11/30/15 1131  BP: 120/70  Pulse: 73  Temp: 97.8 F (36.6 C)  TempSrc: Oral  Resp: 18  Height: 5\' 4"  (1.626 m)  Weight: 149 lb 4 oz (67.699 kg)  SpO2: 96%         Assessment & Plan:   Maria Olsen is a 75 y.o. female COPD exacerbation ([place]) - Plan: azithromycin (ZITHROMAX) 250 MG tablet, albuterol (PROVENTIL HFA;VENTOLIN HFA) 108 (90 Base) MCG/ACT inhaler  - Early COPD exacerbation, with productive discolored mucus. Start Z-Pak, albuterol inhaler every 4-6 hours as needed, consider prednisone if frequent or persistent use of albuterol. RTC precautions.  Acute nonintractable headache, unspecified headache type  - Likely due to cough. Nonfocal exam. Tylenol, symptomatic care, RTC precautions.  Meds ordered this encounter  Medications  . Multiple Vitamins-Minerals (MULTIVITAMIN ADULT PO)    Sig: Take by mouth.  . calcium-vitamin D (OSCAL-500) 500-400 MG-UNIT tablet    Sig: Take 1 tablet by mouth daily.  Marland Kitchen azithromycin (ZITHROMAX) 250 MG tablet    Sig: Take 2 pills by mouth on day 1, then 1 pill by mouth per day on days 2 through 5.    Dispense:  6 tablet    Refill:  0  . albuterol (PROVENTIL HFA;VENTOLIN HFA) 108 (90 Base) MCG/ACT inhaler    Sig: Inhale 1-2 puffs into the lungs every 4 (four) hours as needed for wheezing or shortness of breath.    Dispense:  1 Inhaler    Refill:  1   Patient Instructions        IF you received an x-ray today, you will receive an invoice from [facility]. Please contact [facility] at [phone] with questions or concerns regarding your invoice.   IF you received labwork today, you will receive an invoice from [employer]. Please contact [facility] at [phone] with questions or concerns regarding your invoice.   Our billing staff will not be able to assist you with questions regarding bills from these companies.  You will be contacted with the lab results as soon as they are available. The fastest way to get your results is to activate your My Chart account. Instructions are located on the last page of this paperwork. If you have  not heard from [place] regarding the results in 2 weeks, please contact this office.    I suspect you have a COPD exacerbation. Start Z-Pak, albuterol if needed for wheezing, Mucinex for cough. If you require albuterol more than 3-4 times per day or persistently need this in the next 3-4 days, return for recheck as we may need to use prednisone at that point. Return sooner if short of breath or fevers.  Chronic Obstructive Pulmonary Disease Chronic obstructive pulmonary disease (COPD) is a common lung condition in which airflow from the lungs is limited. COPD is a general term that can be used to describe many different lung problems that limit airflow, including both chronic bronchitis and emphysema. If you have COPD, your lung function will probably never return to normal, but there are measures you can take to improve lung function and make yourself feel better. CAUSES   Smoking (common).  Exposure to secondhand smoke.  Genetic problems.  Chronic inflammatory lung diseases or recurrent infections. SYMPTOMS  Shortness of breath, especially with physical activity.  Deep, persistent (chronic) cough with a large amount of thick mucus.  Wheezing.  Rapid breaths (tachypnea).  Gray  or bluish discoloration (cyanosis) of the skin, especially in your fingers, toes, or lips.  Fatigue.  Weight loss.  Frequent infections or episodes when breathing symptoms become much worse (exacerbations).  Chest tightness. DIAGNOSIS Your health care provider will take a medical history and perform a physical examination to diagnose COPD. Additional tests for COPD may include:  Lung (pulmonary) function tests.  Chest X-ray.  CT scan.  Blood tests. TREATMENT  Treatment for COPD may include:  Inhaler and nebulizer medicines. These help manage the symptoms of COPD and make your breathing more comfortable.  Supplemental oxygen. Supplemental oxygen is only helpful if you have a low oxygen level in your blood.  Exercise and physical activity. These are beneficial for nearly all people with COPD.  Lung surgery or transplant.  Nutrition therapy to gain weight, if you are underweight.  Pulmonary rehabilitation. This may involve working with a team of health care providers and specialists, such as respiratory, occupational, and physical therapists. HOME CARE INSTRUCTIONS  Take all medicines (inhaled or pills) as directed by your health care provider.  Avoid over-the-counter medicines or cough syrups that dry up your airway (such as antihistamines) and slow down the elimination of secretions unless instructed otherwise by your health care provider.  If you are a smoker, the most important thing that you can do is stop smoking. Continuing to smoke will cause further lung damage and breathing trouble. Ask your health care provider for help with quitting smoking. He or she can direct you to community resources or hospitals that provide support.  Avoid exposure to irritants such as smoke, chemicals, and fumes that aggravate your breathing.  Use oxygen therapy and pulmonary rehabilitation if directed by your health care provider. If you require home oxygen therapy, ask your health care  provider whether you should purchase a pulse oximeter to measure your oxygen level at home.  Avoid contact with individuals who have a contagious illness.  Avoid extreme temperature and humidity changes.  Eat healthy foods. Eating smaller, more frequent meals and resting before meals may help you maintain your strength.  Stay active, but balance activity with periods of rest. Exercise and physical activity will help you maintain your ability to do things you want to do.  Preventing infection and hospitalization is very important when you have COPD. Make sure to  receive all the vaccines your health care provider recommends, especially the pneumococcal and influenza vaccines. Ask your health care provider whether you need a pneumonia vaccine.  Learn and use relaxation techniques to manage stress.  Learn and use controlled breathing techniques as directed by your health care provider. Controlled breathing techniques include:  Pursed lip breathing. Start by breathing in (inhaling) through your nose for 1 second. Then, purse your lips as if you were going to whistle and breathe out (exhale) through the pursed lips for 2 seconds.  Diaphragmatic breathing. Start by putting one hand on your abdomen just above your waist. Inhale slowly through your nose. The hand on your abdomen should move out. Then purse your lips and exhale slowly. You should be able to feel the hand on your abdomen moving in as you exhale.  Learn and use controlled coughing to clear mucus from your lungs. Controlled coughing is a series of short, progressive coughs. The steps of controlled coughing are: 1. Lean your head slightly forward. 2. Breathe in deeply using diaphragmatic breathing. 3. Try to hold your breath for 3 seconds. 4. Keep your mouth slightly open while coughing twice. 5. Spit any mucus out into a tissue. 6. Rest and repeat the steps once or twice as needed. SEEK MEDICAL CARE IF:  You are coughing up more  mucus than usual.  There is a change in the color or thickness of your mucus.  Your breathing is more labored than usual.  Your breathing is faster than usual. SEEK IMMEDIATE MEDICAL CARE IF:  You have shortness of breath while you are resting.  You have shortness of breath that prevents you from:  Being able to talk.  Performing your usual physical activities.  You have chest pain lasting longer than 5 minutes.  Your skin color is more cyanotic than usual.  You measure low oxygen saturations for longer than 5 minutes with a pulse oximeter. MAKE SURE YOU:  Understand these instructions.  Will watch your condition.  Will get help right away if you are not doing well or get worse.   This information is not intended to replace advice given to you by your health care provider. Make sure you discuss any questions you have with your health care provider.   Document Released: 04/01/2005 Document Revised: 07/13/2014 Document Reviewed: 02/16/2013 Elsevier Interactive Patient Education [employer].     I personally performed the services described in this documentation, which was scribed in my presence. The recorded information has been reviewed and considered, and addended by me as needed.

## 2016-01-21 DIAGNOSIS — H2513 Age-related nuclear cataract, bilateral: Secondary | ICD-10-CM | POA: Diagnosis not present

## 2016-01-21 DIAGNOSIS — H40033 Anatomical narrow angle, bilateral: Secondary | ICD-10-CM | POA: Diagnosis not present

## 2016-04-13 DIAGNOSIS — Z23 Encounter for immunization: Secondary | ICD-10-CM | POA: Diagnosis not present

## 2016-08-15 ENCOUNTER — Encounter: Payer: Self-pay | Admitting: Family Medicine

## 2016-08-15 DIAGNOSIS — J441 Chronic obstructive pulmonary disease with (acute) exacerbation: Secondary | ICD-10-CM

## 2016-08-18 MED ORDER — FLUTICASONE FUROATE-VILANTEROL 100-25 MCG/INH IN AEPB: 1.0000 | INHALATION_SPRAY | Freq: Every day | RESPIRATORY_TRACT | 5 refills | Status: DC

## 2016-08-18 MED ORDER — ALBUTEROL SULFATE HFA 108 (90 BASE) MCG/ACT IN AERS: 1.0000 | INHALATION_SPRAY | RESPIRATORY_TRACT | 1 refills | Status: DC | PRN

## 2016-09-07 ENCOUNTER — Encounter: Payer: Self-pay | Admitting: Family Medicine

## 2016-09-21 ENCOUNTER — Encounter: Payer: Self-pay | Admitting: Family Medicine

## 2016-09-28 ENCOUNTER — Ambulatory Visit (INDEPENDENT_AMBULATORY_CARE_PROVIDER_SITE_OTHER): Payer: Medicare Other

## 2016-09-28 ENCOUNTER — Ambulatory Visit (INDEPENDENT_AMBULATORY_CARE_PROVIDER_SITE_OTHER): Payer: Medicare Other | Admitting: Urgent Care

## 2016-09-28 VITALS — BP 91/57 | HR 69 | Temp 98.8°F | Resp 16 | Ht 64.0 in | Wt 155.8 lb

## 2016-09-28 DIAGNOSIS — R55 Syncope and collapse: Secondary | ICD-10-CM | POA: Diagnosis not present

## 2016-09-28 DIAGNOSIS — R05 Cough: Secondary | ICD-10-CM | POA: Diagnosis not present

## 2016-09-28 DIAGNOSIS — R112 Nausea with vomiting, unspecified: Secondary | ICD-10-CM | POA: Diagnosis not present

## 2016-09-28 DIAGNOSIS — R0602 Shortness of breath: Secondary | ICD-10-CM | POA: Diagnosis not present

## 2016-09-28 DIAGNOSIS — R059 Cough, unspecified: Secondary | ICD-10-CM

## 2016-09-28 DIAGNOSIS — J441 Chronic obstructive pulmonary disease with (acute) exacerbation: Secondary | ICD-10-CM

## 2016-09-28 MED ORDER — AZITHROMYCIN 250 MG PO TABS
ORAL_TABLET | ORAL | 0 refills | Status: DC
Start: 2016-09-28 — End: 2017-03-12

## 2016-09-28 MED ORDER — BENZONATATE 100 MG PO CAPS: 100.0000 mg | ORAL_CAPSULE | Freq: Three times a day (TID) | ORAL | 0 refills | Status: DC | PRN

## 2016-09-28 MED ORDER — PREDNISONE 20 MG PO TABS: ORAL_TABLET | ORAL | 0 refills | Status: DC

## 2016-09-28 NOTE — Progress Notes (Signed)
MRN: [id number] DOB: 08/06/1940  Subjective:   Maria Olsen is a 76 y.o. female presenting for chief complaint of Shortness of Breath (x 4 days, "had gotten better, gotten worse, sharp pain behind both ears) and Cough (thick Le[place] mucus)  Reports 4 day history of shob, worsening productive cough. Has a history of COPD, is not on home oxygen.  Of note, patient felt weak and fatigued, had syncope on 09/26/2016. When she came to, patient had vomiting episode. Has not vomited since then. Also has had mild cervical lymph node pain. Uses Breo Ellipta daily, started using albuterol inhaler twice daily for the past week. Generally does not use it this often. Denies fever, chest pain, abdominal pain, diaphoresis, neck pain, jaw pain, limb pain. Denies smoking cigarettes. Has a history of breast cancer, s/p mastectomy, currently in remission.   Maria Olsen has a current medication list which includes the following prescription(s): albuterol, calcium-vitamin d, fluticasone furoate-vilanterol, multiple vitamins-minerals, and tiotropium. Also is allergic to demerol [meperidine]; doxycycline; and monocid [cefonicid]. Maria Olsen  has a past medical history of [facility]); COPD (chronic obstructive pulmonary disease) ([place]); and Hypertension. Also  has a past surgical history that includes Breast surgery; Cesarean section; Neck surgery; and Tubal ligation.  Objective:   Vitals: BP (!) 91/57 (BP Location: Right Arm, Patient Position: Sitting, Cuff Size: Small)   Pulse 69   Temp 98.8 F (37.1 C) (Oral)   Resp 16   Ht 5\' 4"  (1.626 m)   Wt 155 lb 12.8 oz (70.7 kg)   SpO2 99%   BMI 26.74 kg/m   Orthostatic VS for the past 24 hrs:  BP- Lying Pulse- Lying BP- Sitting Pulse- Sitting BP- Standing at 0 minutes Pulse- Standing at 0 minutes  09/28/16 1715 138/70 71 130/78 76 120/72 82  09/28/16 1713 138/70 71 130/78 76 120/72 82    Physical Exam  Constitutional: She is oriented to person, place, and time. She appears  well-developed and well-nourished.  HENT:  Mouth/Throat: Oropharynx is clear and moist.  Eyes: Right eye exhibits no discharge. Left eye exhibits no discharge. No scleral icterus.  Neck: Normal range of motion. Neck supple.  Cardiovascular: Normal rate, regular rhythm and intact distal pulses.  Exam reveals no gallop and no friction rub.   No murmur heard. Pulmonary/Chest: No respiratory distress. She has wheezes (anterior mid-lower lung fields). She has no rales.  Neurological: She is alert and oriented to person, place, and time.  Skin: Skin is warm and dry.   Dg Chest 2 View  Result Date: 09/28/2016 CLINICAL DATA:  Cough and dyspnea EXAM: CHEST  2 VIEW COMPARISON:  06/30/2013 by FINDINGS: Mild hyperinflation of the lungs without pneumonic consolidation, effusion or pneumothorax. No overt pulmonary edema. Cardiac and mediastinal contours are within normal limits. Axillary clips are seen on the left. Old fracture deformity of the right lateral sixth rib. Mild degenerative change along the dorsal spine. IMPRESSION: No active cardiopulmonary disease. Mild hyperinflation of the lungs. Electronically Signed   By: Ashley Royalty M.D.   On: 09/28/2016 16:44   ECG interpretation - T-wave inversion in lead V2 but otherwise no acute findings noted. Patient is in sinus rhythm at 68bpm.  Assessment and Plan :   This case was precepted with Dr. Mitchel Honour.   1. Shortness of breath 2. Cough 3. Nausea and vomiting, intractability of vomiting not specified, unspecified vomiting type 4. Syncope, unspecified syncope type 5. COPD exacerbation ([place]) - Will manage as a mild COPD exacerbation. Start Azithromycin, schedule  albuterol inhaler. Start short steroid course. Precautions given warranting ER visit. Otherwise, rtc in 1 week if no improvement.  Jaynee Eagles, PA-C [facility] [phone] 09/28/2016  4:21 PM

## 2016-09-28 NOTE — Patient Instructions (Addendum)
Shortness of Breath, Adult Shortness of breath is when a person has trouble breathing enough air, or when a person feels like she or he is having trouble breathing in enough air. Shortness of breath could be a sign of medical problem. Follow these instructions at home: Pay attention to any changes in your symptoms. Take these actions to help with your condition:  Do not smoke. Smoking is a common cause of shortness of breath. If you smoke and you need help quitting, ask your health care provider.  Avoid things that can irritate your airways, such as:  Mold.  Dust.  Air pollution.  Chemical fumes.  Things that can cause allergy symptoms (allergens), if you have allergies.  Keep your living space clean and free of mold and dust.  Rest as needed. Slowly return to your usual activities.  Take over-the-counter and prescription medicines, including oxygen and inhaled medicines, only as told by your health care provider.  Keep all follow-up visits as told by your health care provider. This is important. Contact a health care provider if:  Your condition does not improve as soon as expected.  You have a hard time doing your normal activities, even after you rest.  You have new symptoms. Get help right away if:  Your shortness of breath gets worse.  You have shortness of breath when you are resting.  You feel light-headed or you faint.  You have a cough that is not controlled with medicines.  You cough up blood.  You have pain with breathing.  You have pain in your chest, arms, shoulders, or abdomen.  You have a fever.  You cannot walk up stairs or exercise the way that you normally do. This information is not intended to replace advice given to you by your health care provider. Make sure you discuss any questions you have with your health care provider. Document Released: 03/17/2001 Document Revised: 01/11/2016 Document Reviewed: 11/28/2015 Elsevier Interactive Patient  Education  2017 [employer]   Chronic Obstructive Pulmonary Disease Chronic obstructive pulmonary disease (COPD) is a common lung condition in which airflow from the lungs is limited. COPD is a general term that can be used to describe many different lung problems that limit airflow, including both chronic bronchitis and emphysema. If you have COPD, your lung function will probably never return to normal, but there are measures you can take to improve lung function and make yourself feel better. What are the causes?  Smoking (common).  Exposure to secondhand smoke.  Genetic problems.  Chronic inflammatory lung diseases or recurrent infections. What are the signs or symptoms?  Shortness of breath, especially with physical activity.  Deep, persistent (chronic) cough with a large amount of thick mucus.  Wheezing.  Rapid breaths (tachypnea).  Gray or bluish discoloration (cyanosis) of the skin, especially in your fingers, toes, or lips.  Fatigue.  Weight loss.  Frequent infections or episodes when breathing symptoms become much worse (exacerbations).  Chest tightness. How is this diagnosed? Your health care provider will take a medical history and perform a physical examination to diagnose COPD. Additional tests for COPD may include:  Lung (pulmonary) function tests.  Chest X-ray.  CT scan.  Blood tests. How is this treated? Treatment for COPD may include:  Inhaler and nebulizer medicines. These help manage the symptoms of COPD and make your breathing more comfortable.  Supplemental oxygen. Supplemental oxygen is only helpful if you have a low oxygen level in your blood.  Exercise and physical activity. These  are beneficial for nearly all people with COPD.  Lung surgery or transplant.  Nutrition therapy to gain weight, if you are underweight.  Pulmonary rehabilitation. This may involve working with a team of health care providers and specialists, such as  respiratory, occupational, and physical therapists. Follow these instructions at home:  Take all medicines (inhaled or pills) as directed by your health care provider.  Avoid over-the-counter medicines or cough syrups that dry up your airway (such as antihistamines) and slow down the elimination of secretions unless instructed otherwise by your health care provider.  If you are a smoker, the most important thing that you can do is stop smoking. Continuing to smoke will cause further lung damage and breathing trouble. Ask your health care provider for help with quitting smoking. He or she can direct you to community resources or hospitals that provide support.  Avoid exposure to irritants such as smoke, chemicals, and fumes that aggravate your breathing.  Use oxygen therapy and pulmonary rehabilitation if directed by your health care provider. If you require home oxygen therapy, ask your health care provider whether you should purchase a pulse oximeter to measure your oxygen level at home.  Avoid contact with individuals who have a contagious illness.  Avoid extreme temperature and humidity changes.  Eat healthy foods. Eating smaller, more frequent meals and resting before meals may help you maintain your strength.  Stay active, but balance activity with periods of rest. Exercise and physical activity will help you maintain your ability to do things you want to do.  Preventing infection and hospitalization is very important when you have COPD. Make sure to receive all the vaccines your health care provider recommends, especially the pneumococcal and influenza vaccines. Ask your health care provider whether you need a pneumonia vaccine.  Learn and use relaxation techniques to manage stress.  Learn and use controlled breathing techniques as directed by your health care provider. Controlled breathing techniques include: 1. Pursed lip breathing. Start by breathing in (inhaling) through your nose  for 1 second. Then, purse your lips as if you were going to whistle and breathe out (exhale) through the pursed lips for 2 seconds. 2. Diaphragmatic breathing. Start by putting one hand on your abdomen just above your waist. Inhale slowly through your nose. The hand on your abdomen should move out. Then purse your lips and exhale slowly. You should be able to feel the hand on your abdomen moving in as you exhale.  Learn and use controlled coughing to clear mucus from your lungs. Controlled coughing is a series of short, progressive coughs. The steps of controlled coughing are: 1. Lean your head slightly forward. 2. Breathe in deeply using diaphragmatic breathing. 3. Try to hold your breath for 3 seconds. 4. Keep your mouth slightly open while coughing twice. 5. Spit any mucus out into a tissue. 6. Rest and repeat the steps once or twice as needed. Contact a health care provider if:  You are coughing up more mucus than usual.  There is a change in the color or thickness of your mucus.  Your breathing is more labored than usual.  Your breathing is faster than usual. Get help right away if:  You have shortness of breath while you are resting.  You have shortness of breath that prevents you from:  Being able to talk.  Performing your usual physical activities.  You have chest pain lasting longer than 5 minutes.  Your skin color is more cyanotic than usual.  You measure  low oxygen saturations for longer than 5 minutes with a pulse oximeter. This information is not intended to replace advice given to you by your health care provider. Make sure you discuss any questions you have with your health care provider. Document Released: 04/01/2005 Document Revised: 11/28/2015 Document Reviewed: 02/16/2013 Elsevier Interactive Patient Education  2017 [employer].   IF you received an x-ray today, you will receive an invoice from [facility]. Please contact [facility] at  [phone] with questions or concerns regarding your invoice.   IF you received labwork today, you will receive an invoice from [facility]. Please contact [facility] at [phone] with questions or concerns regarding your invoice.   Our billing staff will not be able to assist you with questions regarding bills from these companies.  You will be contacted with the lab results as soon as they are available. The fastest way to get your results is to activate your My Chart account. Instructions are located on the last page of this paperwork. If you have not heard from [place] regarding the results in 2 weeks, please contact this office.

## 2017-03-08 DIAGNOSIS — H40033 Anatomical narrow angle, bilateral: Secondary | ICD-10-CM | POA: Diagnosis not present

## 2017-03-08 DIAGNOSIS — H2513 Age-related nuclear cataract, bilateral: Secondary | ICD-10-CM | POA: Diagnosis not present

## 2017-03-09 ENCOUNTER — Other Ambulatory Visit: Payer: Self-pay | Admitting: Family Medicine

## 2017-03-09 DIAGNOSIS — Z1231 Encounter for screening mammogram for malignant neoplasm of breast: Secondary | ICD-10-CM

## 2017-03-12 ENCOUNTER — Encounter: Payer: Self-pay | Admitting: Family Medicine

## 2017-03-12 ENCOUNTER — Ambulatory Visit (INDEPENDENT_AMBULATORY_CARE_PROVIDER_SITE_OTHER): Payer: Medicare Other

## 2017-03-12 ENCOUNTER — Other Ambulatory Visit: Payer: Self-pay | Admitting: Family Medicine

## 2017-03-12 VITALS — BP 146/79 | HR 67 | Temp 97.5°F | Ht 64.0 in | Wt 156.0 lb

## 2017-03-12 DIAGNOSIS — I10 Essential (primary) hypertension: Secondary | ICD-10-CM | POA: Diagnosis not present

## 2017-03-12 DIAGNOSIS — Z13220 Encounter for screening for lipoid disorders: Secondary | ICD-10-CM | POA: Diagnosis not present

## 2017-03-12 DIAGNOSIS — Z23 Encounter for immunization: Secondary | ICD-10-CM | POA: Diagnosis not present

## 2017-03-12 DIAGNOSIS — Z0000 Encounter for general adult medical examination without abnormal findings: Secondary | ICD-10-CM

## 2017-03-12 DIAGNOSIS — E2839 Other primary ovarian failure: Secondary | ICD-10-CM

## 2017-03-12 DIAGNOSIS — J441 Chronic obstructive pulmonary disease with (acute) exacerbation: Secondary | ICD-10-CM

## 2017-03-12 NOTE — Patient Instructions (Addendum)
Maria Olsen , Thank you for taking time to come for your Medicare Wellness Visit. I appreciate your ongoing commitment to your health goals. Please review the following plan we discussed and let me know if I can assist you in the future.   Screening recommendations/referrals: Colonoscopy: up to date, next due 07/06/2022 Mammogram: scheduled for 03/18/17 Bone Density: due, order in system, please call and schedule Recommended yearly ophthalmology/optometry visit for glaucoma screening and checkup Recommended yearly dental visit for hygiene and checkup  Vaccinations: Influenza vaccine: administered today Pneumococcal vaccine: up to date Tdap vaccine: due, declined due to insurance Shingles vaccine: due, check with your pharmacy   Advanced directives: Please bring a copy of your POA (Power of Attorney) and/or Living Will to your next appointment.   Conditions/risks identified: You want to lose about 6 lbs over the next month. Please try to increase exercising.  Next appointment: schedule follow up visit with pcp and 1 year for AWV   Preventive Care 75 Years and Older, Female Preventive care refers to lifestyle choices and visits with your health care provider that can promote health and wellness. What does preventive care include?  A yearly physical exam. This is also called an annual well check.  Dental exams once or twice a year.  Routine eye exams. Ask your health care provider how often you should have your eyes checked.  Personal lifestyle choices, including:  Daily care of your teeth and gums.  Regular physical activity.  Eating a healthy diet.  Avoiding tobacco and drug use.  Limiting alcohol use.  Practicing safe sex.  Taking low-dose aspirin every day.  Taking vitamin and mineral supplements as recommended by your health care provider. What happens during an annual well check? The services and screenings done by your health care provider during your annual well  check will depend on your age, overall health, lifestyle risk factors, and family history of disease. Counseling  Your health care provider may ask you questions about your:  Alcohol use.  Tobacco use.  Drug use.  Emotional well-being.  Home and relationship well-being.  Sexual activity.  Eating habits.  History of falls.  Memory and ability to understand (cognition).  Work and work Statistician.  Reproductive health. Screening  You may have the following tests or measurements:  Height, weight, and BMI.  Blood pressure.  Lipid and cholesterol levels. These may be checked every 5 years, or more frequently if you are over 75 years old.  Skin check.  Lung cancer screening. You may have this screening every year starting at age 75 if you have a 30-pack-year history of smoking and currently smoke or have quit within the past 15 years.  Fecal occult blood test (FOBT) of the stool. You may have this test every year starting at age 75.  Flexible sigmoidoscopy or colonoscopy. You may have a sigmoidoscopy every 5 years or a colonoscopy every 10 years starting at age 75.  Hepatitis C blood test.  Hepatitis B blood test.  Sexually transmitted disease (STD) testing.  Diabetes screening. This is done by checking your blood sugar (glucose) after you have not eaten for a while (fasting). You may have this done every 1-3 years.  Bone density scan. This is done to screen for osteoporosis. You may have this done starting at age 75.  Mammogram. This may be done every 1-2 years. Talk to your health care provider about how often you should have regular mammograms. Talk with your health care provider about your test  results, treatment options, and if necessary, the need for more tests. Vaccines  Your health care provider may recommend certain vaccines, such as:  Influenza vaccine. This is recommended every year.  Tetanus, diphtheria, and acellular pertussis (Tdap, Td) vaccine. You  may need a Td booster every 10 years.  Zoster vaccine. You may need this after age 75.  Pneumococcal 13-valent conjugate (PCV13) vaccine. One dose is recommended after age 75.  Pneumococcal polysaccharide (PPSV23) vaccine. One dose is recommended after age 75. Talk to your health care provider about which screenings and vaccines you need and how often you need them. This information is not intended to replace advice given to you by your health care provider. Make sure you discuss any questions you have with your health care provider. Document Released: 07/19/2015 Document Revised: 03/11/2016 Document Reviewed: 04/23/2015 Elsevier Interactive Patient Education  2017 [place] Prevention in the Home Falls can cause injuries. They can happen to people of all ages. There are many things you can do to make your home safe and to help prevent falls. What can I do on the outside of my home?  Regularly fix the edges of walkways and driveways and fix any cracks.  Remove anything that might make you trip as you walk through a door, such as a raised step or threshold.  Trim any bushes or trees on the path to your home.  Use bright outdoor lighting.  Clear any walking paths of anything that might make someone trip, such as rocks or tools.  Regularly check to see if handrails are loose or broken. Make sure that both sides of any steps have handrails.  Any raised decks and porches should have guardrails on the edges.  Have any leaves, snow, or ice cleared regularly.  Use sand or salt on walking paths during winter.  Clean up any spills in your garage right away. This includes oil or grease spills. What can I do in the bathroom?  Use night lights.  Install grab bars by the toilet and in the tub and shower. Do not use towel bars as grab bars.  Use non-skid mats or decals in the tub or shower.  If you need to sit down in the shower, use a plastic, non-slip stool.  Keep the floor  dry. Clean up any water that spills on the floor as soon as it happens.  Remove soap buildup in the tub or shower regularly.  Attach bath mats securely with double-sided non-slip rug tape.  Do not have throw rugs and other things on the floor that can make you trip. What can I do in the bedroom?  Use night lights.  Make sure that you have a light by your bed that is easy to reach.  Do not use any sheets or blankets that are too big for your bed. They should not hang down onto the floor.  Have a firm chair that has side arms. You can use this for support while you get dressed.  Do not have throw rugs and other things on the floor that can make you trip. What can I do in the kitchen?  Clean up any spills right away.  Avoid walking on wet floors.  Keep items that you use a lot in easy-to-reach places.  If you need to reach something above you, use a strong step stool that has a grab bar.  Keep electrical cords out of the way.  Do not use floor polish or wax that makes  floors slippery. If you must use wax, use non-skid floor wax.  Do not have throw rugs and other things on the floor that can make you trip. What can I do with my stairs?  Do not leave any items on the stairs.  Make sure that there are handrails on both sides of the stairs and use them. Fix handrails that are broken or loose. Make sure that handrails are as long as the stairways.  Check any carpeting to make sure that it is firmly attached to the stairs. Fix any carpet that is loose or worn.  Avoid having throw rugs at the top or bottom of the stairs. If you do have throw rugs, attach them to the floor with carpet tape.  Make sure that you have a light switch at the top of the stairs and the bottom of the stairs. If you do not have them, ask someone to add them for you. What else can I do to help prevent falls?  Wear shoes that:  Do not have high heels.  Have rubber bottoms.  Are comfortable and fit you  well.  Are closed at the toe. Do not wear sandals.  If you use a stepladder:  Make sure that it is fully opened. Do not climb a closed stepladder.  Make sure that both sides of the stepladder are locked into place.  Ask someone to hold it for you, if possible.  Clearly mark and make sure that you can see:  Any grab bars or handrails.  First and last steps.  Where the edge of each step is.  Use tools that help you move around (mobility aids) if they are needed. These include:  Canes.  Walkers.  Scooters.  Crutches.  Turn on the lights when you go into a dark area. Replace any light bulbs as soon as they burn out.  Set up your furniture so you have a clear path. Avoid moving your furniture around.  If any of your floors are uneven, fix them.  If there are any pets around you, be aware of where they are.  Review your medicines with your doctor. Some medicines can make you feel dizzy. This can increase your chance of falling. Ask your doctor what other things that you can do to help prevent falls. This information is not intended to replace advice given to you by your health care provider. Make sure you discuss any questions you have with your health care provider. Document Released: 04/18/2009 Document Revised: 11/28/2015 Document Reviewed: 07/27/2014 Elsevier Interactive Patient Education  2017 [employer].

## 2017-03-12 NOTE — Progress Notes (Signed)
Subjective:   Maria Olsen is a 76 y.o. female who presents for Medicare Annual (Subsequent) preventive examination.  Review of Systems:  N/A Cardiac Risk Factors include: advanced age (>24men, >60 women);hypertension     Objective:     Vitals: BP (!) 146/79   Pulse 67   Temp (!) 97.5 F (36.4 C) (Oral)   Ht 5\' 4"  (1.626 m)   Wt 156 lb (70.8 kg)   BMI 26.78 kg/m   Body mass index is 26.78 kg/m.   Tobacco History  Smoking Status  . Former Smoker  . Types: Cigarettes  Smokeless Tobacco  . Never Used     Counseling given: Not Answered   Past Medical History:  Diagnosis Date  . Cancer ([place])   . COPD (chronic obstructive pulmonary disease) ([place])   . Hypertension    Past Surgical History:  Procedure Laterality Date  . BREAST SURGERY    . CESAREAN SECTION    . NECK SURGERY    . TUBAL LIGATION     Family History  Problem Relation Age of Onset  . Kidney failure Father   . Diabetes Father   . Heart disease Father   . Stroke Father   . Melanoma Sister   . Cancer Brother   . Mental illness Brother    History  Sexual Activity  . Sexual activity: Not on file    Outpatient Encounter Prescriptions as of 03/12/2017  Medication Sig  . acetaminophen (TYLENOL) 500 MG tablet Take 500 mg by mouth every 6 (six) hours as needed.  Marland Kitchen albuterol (PROVENTIL HFA;VENTOLIN HFA) 108 (90 Base) MCG/ACT inhaler Inhale 1-2 puffs into the lungs every 4 (four) hours as needed for wheezing or shortness of breath.  . calcium-vitamin D (OSCAL-500) 500-400 MG-UNIT tablet Take 1 tablet by mouth daily.  . fluticasone furoate-vilanterol (BREO ELLIPTA) 100-25 MCG/INH AEPB Inhale 1 puff into the lungs daily.  Marland Kitchen ibuprofen (ADVIL,MOTRIN) 200 MG tablet Take 200 mg by mouth every 6 (six) hours as needed.  . Multiple Vitamins-Minerals (MULTIVITAMIN ADULT PO) Take by mouth.  . [DISCONTINUED] azithromycin (ZITHROMAX) 250 MG tablet Start with 2 tablets today, then 1 daily thereafter.  .  [DISCONTINUED] benzonatate (TESSALON) 100 MG capsule Take 1-2 capsules (100-200 mg total) by mouth 3 (three) times daily as needed.  . [DISCONTINUED] predniSONE (DELTASONE) 20 MG tablet Take 2 tablets daily with breakfast.  . [DISCONTINUED] tiotropium (SPIRIVA HANDIHALER) 18 MCG inhalation capsule INHALE ONE DOSE BY MOUTH ONCE DAILY (Patient not taking: Reported on 09/28/2016)   No facility-administered encounter medications on file as of 03/12/2017.     Activities of Daily Living In your present state of health, do you have any difficulty performing the following activities: 03/12/2017  Hearing? Y  Comment Patient has trouble hearing in large crowds.  Vision? Y  Comment Patient has cataracts.  Difficulty concentrating or making decisions? N  Walking or climbing stairs? N  Dressing or bathing? N  Doing errands, shopping? N  Preparing Food and eating ? N  Using the Toilet? N  In the past six months, have you accidently leaked urine? N  Do you have problems with loss of bowel control? N  Managing your Medications? N  Managing your Finances? N  Housekeeping or managing your Housekeeping? N  Some recent data might be hidden    Patient Care Team: [name], MD as PCP - General (Family Medicine) [name], MD (Family Medicine) [place] Comment, [employer], [place] as Referring Physician (Optometry)  Assessment:     Exercise Activities and Dietary recommendations Current Exercise Habits: The patient does not participate in regular exercise at present, Exercise limited by: None identified  Goals    . Weight (lb) < 150 lb (68 kg)          Patient wants to lose about 6 lbs over the next month. Will try to increase exercising.      Fall Risk Fall Risk  03/12/2017 09/28/2016 11/30/2015 10/16/2015 11/25/2014  Falls in the past year? No Yes No No No  Comment - 09/26/16, few scratches on arms, scratch on right lower leg - - -   Depression Screen PHQ 2/9 Scores 03/12/2017 09/28/2016 11/30/2015  10/16/2015  PHQ - 2 Score 0 0 0 0     Cognitive Function     6CIT Screen 03/12/2017  What Year? 0 points  What month? 0 points  What time? 0 points  Count back from 20 0 points  Months in reverse 0 points  Repeat phrase 0 points  Total Score 0    Immunization History  Administered Date(s) Administered  . Influenza Split 06/08/2012  . Influenza,inj,Quad PF,6+ Mos 03/12/2017  . Influenza-Unspecified 04/13/2016  . Pneumococcal Conjugate-13 05/21/2014  . Pneumococcal Polysaccharide-23 10/16/2015   Screening Tests Health Maintenance  Topic Date Due  . TETANUS/TDAP  03/12/2018 (Originally 03/27/1960)  . MAMMOGRAM  09/29/2017  . COLONOSCOPY  07/06/2022  . INFLUENZA VACCINE  Completed  . DEXA SCAN  Completed  . PNA vac Low Risk Adult  Completed      Plan:   I have personally reviewed and noted the following in the patient's chart:   . Medical and social history . Use of alcohol, tobacco or illicit drugs  . Current medications and supplements . Functional ability and status . Nutritional status . Physical activity . Advanced directives . List of other physicians . Hospitalizations, surgeries, and ER visits in previous 12 months . Vitals . Screenings to include cognitive, depression, and falls . Referrals and appointments  In addition, I have reviewed and discussed with patient certain preventive protocols, quality metrics, and best practice recommendations. A written personalized care plan for preventive services as well as general preventive health recommendations were provided to patient.  Lab work ordered. Bone Density ordered. Patient will call and schedule.    Andrez Grime, LPN  09/10/8586

## 2017-03-13 LAB — LIPID PANEL
Chol/HDL Ratio: 2.5 ratio (ref 0.0–4.4)
Cholesterol, Total: 169 mg/dL (ref 100–199)
HDL: 67 mg/dL (ref >39)
LDL Calculated: 79 mg/dL (ref 0–99)
Triglycerides: 116 mg/dL (ref 0–149)
VLDL Cholesterol Cal: 23 mg/dL (ref 5–40)

## 2017-03-13 LAB — MICROSCOPIC EXAMINATION
BACTERIA UA: NONE SEEN
Casts: NONE SEEN /lpf

## 2017-03-13 LAB — CBC WITH DIFFERENTIAL/PLATELET
BASOS ABS: 0.1 10*3/uL (ref 0.0–0.2)
Basos: 1 %
EOS (ABSOLUTE): 0.2 10*3/uL (ref 0.0–0.4)
Eos: 2 %
Hematocrit: 42 % (ref 34.0–46.6)
Hemoglobin: 13.4 g/dL (ref 11.1–15.9)
Immature Grans (Abs): 0 10*3/uL (ref 0.0–0.1)
Immature Granulocytes: 0 %
LYMPHS ABS: 2.2 10*3/uL (ref 0.7–3.1)
Lymphs: 30 %
MCH: 29.5 pg (ref 26.6–33.0)
MCHC: 31.9 g/dL (ref 31.5–35.7)
MCV: 93 fL (ref 79–97)
MONOS ABS: 0.5 10*3/uL (ref 0.1–0.9)
Monocytes: 7 %
Neutrophils Absolute: 4.3 10*3/uL (ref 1.4–7.0)
Neutrophils: 60 %
Platelets: 345 10*3/uL (ref 150–379)
RBC: 4.54 x10E6/uL (ref 3.77–5.28)
RDW: 14.9 % (ref 12.3–15.4)
WBC: 7.1 10*3/uL (ref 3.4–10.8)

## 2017-03-13 LAB — COMPREHENSIVE METABOLIC PANEL
ALK PHOS: 76 IU/L (ref 39–117)
ALT: 8 IU/L (ref 0–32)
AST: 15 IU/L (ref 0–40)
Albumin/Globulin Ratio: 1.6 (ref 1.2–2.2)
Albumin: 4.4 g/dL (ref 3.5–4.8)
BUN/Creatinine Ratio: 23 (ref 12–28)
BUN: 23 mg/dL (ref 8–27)
Bilirubin Total: 0.4 mg/dL (ref 0.0–1.2)
CHLORIDE: 102 mmol/L (ref 96–106)
CO2: 22 mmol/L (ref 20–29)
CREATININE: 1.02 mg/dL — AB (ref 0.57–1.00)
Calcium: 9.3 mg/dL (ref 8.7–10.3)
GFR calc Af Amer: 63 mL/min/{1.73_m2} (ref >59)
GFR calc non Af Amer: 54 mL/min/{1.73_m2} — ABNORMAL LOW (ref >59)
GLUCOSE: 96 mg/dL (ref 65–99)
Globulin, Total: 2.7 g/dL (ref 1.5–4.5)
Potassium: 4.3 mmol/L (ref 3.5–5.2)
Sodium: 144 mmol/L (ref 134–144)
Total Protein: 7.1 g/dL (ref 6.0–8.5)

## 2017-03-13 LAB — URINALYSIS, COMPLETE
BILIRUBIN UA: NEGATIVE
GLUCOSE, UA: NEGATIVE
KETONES UA: NEGATIVE
Leukocytes, UA: NEGATIVE
NITRITE UA: NEGATIVE
Protein, UA: NEGATIVE
RBC UA: NEGATIVE
SPEC GRAV UA: 1.024 (ref 1.005–1.030)
UUROB: 0.2 mg/dL (ref 0.2–1.0)
pH, UA: 5.5 (ref 5.0–7.5)

## 2017-03-15 ENCOUNTER — Encounter: Payer: Self-pay | Admitting: Family Medicine

## 2017-03-15 NOTE — Telephone Encounter (Signed)
Please advise on refill.

## 2017-03-16 ENCOUNTER — Encounter: Payer: Self-pay | Admitting: Family Medicine

## 2017-03-17 ENCOUNTER — Other Ambulatory Visit: Payer: Self-pay | Admitting: Emergency Medicine

## 2017-03-17 DIAGNOSIS — J441 Chronic obstructive pulmonary disease with (acute) exacerbation: Secondary | ICD-10-CM

## 2017-03-17 MED ORDER — FLUTICASONE FUROATE-VILANTEROL 100-25 MCG/INH IN AEPB: 1.0000 | INHALATION_SPRAY | Freq: Every day | RESPIRATORY_TRACT | 5 refills | Status: DC

## 2017-03-18 ENCOUNTER — Ambulatory Visit: Payer: Medicare Other

## 2017-03-18 NOTE — Telephone Encounter (Signed)
Breo approved by Dr. Carlota Raspberry on 03/17/17.

## 2017-03-23 ENCOUNTER — Ambulatory Visit (INDEPENDENT_AMBULATORY_CARE_PROVIDER_SITE_OTHER): Payer: Medicare Other | Admitting: Family Medicine

## 2017-03-23 ENCOUNTER — Encounter: Payer: Self-pay | Admitting: Family Medicine

## 2017-03-23 VITALS — BP 146/73 | HR 69 | Temp 98.6°F | Resp 16 | Ht 64.0 in | Wt 155.6 lb

## 2017-03-23 DIAGNOSIS — J449 Chronic obstructive pulmonary disease, unspecified: Secondary | ICD-10-CM

## 2017-03-23 DIAGNOSIS — I10 Essential (primary) hypertension: Secondary | ICD-10-CM

## 2017-03-23 MED ORDER — HYDROCHLOROTHIAZIDE 12.5 MG PO TABS: 12.5000 mg | ORAL_TABLET | Freq: Every day | ORAL | 1 refills | Status: DC

## 2017-03-23 NOTE — Patient Instructions (Addendum)
Continue Breo at the same dose, albuterol if needed. If you are experiencing more shortness of breath, or any worsening COPD symptoms, please return to discuss other treatment.  For blood pressure, it was slightly elevated today. Monitor your blood pressures outside of the office and if it remains over 140/90, restart hydrochlorothiazide. Follow-up with me in 6 months unless you restart the medicine, then recheck in 3 months.  Let me know if you need a prescription for shingles vaccine.    IF you received an x-ray today, you will receive an invoice from [facility]. Please contact [facility] at [phone] with questions or concerns regarding your invoice.   IF you received labwork today, you will receive an invoice from [place]. Please contact [facility] at [phone] with questions or concerns regarding your invoice.   Our billing staff will not be able to assist you with questions regarding bills from these companies.  You will be contacted with the lab results as soon as they are available. The fastest way to get your results is to activate your My Chart account. Instructions are located on the last page of this paperwork. If you have not heard from [place] regarding the results in 2 weeks, please contact this office.

## 2017-03-23 NOTE — Progress Notes (Signed)
Subjective:  This chart was scribed for [name], MD by Tamsen Roers, at [place] at [facility].  This patient was seen in room 11 and the patient's care was started at 8:57 AM.   Chief Complaint  Patient presents with  . Follow-up    for bloodwork      Patient ID: Maria Olsen, female    DOB: 1940-07-28, 76 y.o.   MRN: [id number]  HPI  HPI Comments: Maria Olsen is a 76 y.o. female who presents to Primary Care at [facility] for a follow up.  I saw her in May 2017 for a COPD exacerbation and previously in April 2017 for a physical.  Patient received a flu shot today. She is planning on getting a shingles vaccine as well.   COPD:  She takes Breo 1 puff (100-25 mcg) QD.  Albuterol if needed, last flare in May. She was also seen March 26 th of this year for a COPD exacerbation.--- Patient is compliant with Breo and rarely misses a dose.  She uses her Albuterol about once per month. She tries to stay active but does not have any set regime.  Patient does get some shortness of breath with increased activity but denies any chest pain.   Hypertension: History of hypertension, has been off of medications recently.  Blood pressure was overall controlled last year off of medications.  Blood work performed at [facility] exam on September 7th , overall reassuring. ----She has taken HCTZ (12.5 mg) in the past.  Results for orders placed or performed in visit on 03/12/17  Microscopic Examination  Result Value Ref Range   WBC, UA 0-5 0 - 5 /hpf   RBC, UA 0-2 0 - 2 /hpf   Epithelial Cells (non renal) 0-10 0 - 10 /hpf   Casts None seen None seen /lpf   Mucus, UA Present Not Estab.   Bacteria, UA None seen None seen/Few  Lipid panel  Result Value Ref Range   Cholesterol, Total 169 100 - 199 mg/dL   Triglycerides 116 0 - 149 mg/dL   HDL 67 >39 mg/dL   VLDL Cholesterol Cal 23 5 - 40 mg/dL   LDL Calculated 79 0 - 99 mg/dL   Chol/HDL Ratio 2.5 0.0 - 4.4 ratio  Urinalysis,  Complete  Result Value Ref Range   Specific Gravity, UA 1.024 1.005 - 1.030   pH, UA 5.5 5.0 - 7.5   Color, UA Yellow Yellow   Appearance Ur Clear Clear   Leukocytes, UA Negative Negative   Protein, UA Negative Negative/Trace   Glucose, UA Negative Negative   Ketones, UA Negative Negative   RBC, UA Negative Negative   Bilirubin, UA Negative Negative   Urobilinogen, Ur 0.2 0.2 - 1.0 mg/dL   Nitrite, UA Negative Negative   Microscopic Examination Comment    Microscopic Examination See below:   Comprehensive metabolic panel  Result Value Ref Range   Glucose 96 65 - 99 mg/dL   BUN 23 8 - 27 mg/dL   Creatinine, Ser 1.02 (H) 0.57 - 1.00 mg/dL   GFR calc non Af Amer 54 (L) >59 mL/min/1.73   GFR calc Af Amer 63 >59 mL/min/1.73   BUN/Creatinine Ratio 23 12 - 28   Sodium 144 134 - 144 mmol/L   Potassium 4.3 3.5 - 5.2 mmol/L   Chloride 102 96 - 106 mmol/L   CO2 22 20 - 29 mmol/L   Calcium 9.3 8.7 - 10.3 mg/dL   Total  Protein 7.1 6.0 - 8.5 g/dL   Albumin 4.4 3.5 - 4.8 g/dL   Globulin, Total 2.7 1.5 - 4.5 g/dL   Albumin/Globulin Ratio 1.6 1.2 - 2.2   Bilirubin Total 0.4 0.0 - 1.2 mg/dL   Alkaline Phosphatase 76 39 - 117 IU/L   AST 15 0 - 40 IU/L   ALT 8 0 - 32 IU/L  CBC with Differential/Platelet  Result Value Ref Range   WBC 7.1 3.4 - 10.8 x10E3/uL   RBC 4.54 3.77 - 5.28 x10E6/uL   Hemoglobin 13.4 11.1 - 15.9 g/dL   Hematocrit 42.0 34.0 - 46.6 %   MCV 93 79 - 97 fL   MCH 29.5 26.6 - 33.0 pg   MCHC 31.9 31.5 - 35.7 g/dL   RDW 14.9 12.3 - 15.4 %   Platelets 345 150 - 379 x10E3/uL   Neutrophils 60 Not Estab. %   Lymphs 30 Not Estab. %   Monocytes 7 Not Estab. %   Eos 2 Not Estab. %   Basos 1 Not Estab. %   Neutrophils Absolute 4.3 1.4 - 7.0 x10E3/uL   Lymphocytes Absolute 2.2 0.7 - 3.1 x10E3/uL   Monocytes Absolute 0.5 0.1 - 0.9 x10E3/uL   EOS (ABSOLUTE) 0.2 0.0 - 0.4 x10E3/uL   Basophils Absolute 0.1 0.0 - 0.2 x10E3/uL   Immature Granulocytes 0 Not Estab. %   Immature  Grans (Abs) 0.0 0.0 - 0.1 x10E3/uL    Patient Active Problem List   Diagnosis Date Noted  . HTN (hypertension) 06/30/2013   Past Medical History:  Diagnosis Date  . Cancer ([place])   . COPD (chronic obstructive pulmonary disease) ([place])   . Hypertension    Past Surgical History:  Procedure Laterality Date  . BREAST SURGERY    . CESAREAN SECTION    . NECK SURGERY    . TUBAL LIGATION     Allergies  Allergen Reactions  . Demerol [Meperidine] Nausea And Vomiting  . Doxycycline     Sick on the stomach  . Monocid [Cefonicid] Nausea And Vomiting   Prior to Admission medications   Medication Sig Start Date End Date Taking? Authorizing Provider  acetaminophen (TYLENOL) 500 MG tablet Take 500 mg by mouth every 6 (six) hours as needed.    [provider]  albuterol (PROVENTIL HFA;VENTOLIN HFA) 108 (90 Base) MCG/ACT inhaler Inhale 1-2 puffs into the lungs every 4 (four) hours as needed for wheezing or shortness of breath. 08/18/16   [name], MD  calcium-vitamin D (OSCAL-500) 500-400 MG-UNIT tablet Take 1 tablet by mouth daily.    [provider]  fluticasone furoate-vilanterol (BREO ELLIPTA) 100-25 MCG/INH AEPB Inhale 1 puff into the lungs daily. 03/17/17   [name], MD  ibuprofen (ADVIL,MOTRIN) 200 MG tablet Take 200 mg by mouth every 6 (six) hours as needed.    [provider]  Multiple Vitamins-Minerals (MULTIVITAMIN ADULT PO) Take by mouth.    [provider]   Social History   Social History  . Marital status: Married    Spouse name: N/A  . Number of children: N/A  . Years of education: N/A   Occupational History  . RN    Social History Main Topics  . Smoking status: Former Smoker    Types: Cigarettes  . Smokeless tobacco: Never Used  . Alcohol use No     Comment: rare  . Drug use: No  . Sexual activity: Not on file   Other Topics Concern  . Not on  file   Social History Narrative   Married   Education: Secretary/administrator    Exercise: some     Review of Systems  Constitutional: Negative for chills and fever.  Respiratory: Negative for choking.   Cardiovascular: Negative for chest pain.  Gastrointestinal: Negative for nausea and vomiting.  Musculoskeletal: Negative for neck pain and neck stiffness.  Neurological: Negative for syncope and speech difficulty.       Objective:   Physical Exam  Constitutional: She is oriented to person, place, and time. She appears well-developed and well-nourished.  HENT:  Head: Normocephalic and atraumatic.  Eyes: Pupils are equal, round, and reactive to light. Conjunctivae and EOM are normal.  Neck: Carotid bruit is not present.  Cardiovascular: Normal rate, regular rhythm, normal heart sounds and intact distal pulses.   Pulmonary/Chest: Effort normal and breath sounds normal.  Abdominal: Soft. She exhibits no pulsatile midline mass. There is no tenderness.  Neurological: She is alert and oriented to person, place, and time.  Skin: Skin is warm and dry.  Psychiatric: She has a normal mood and affect. Her behavior is normal.  Vitals reviewed.   Vitals:   03/23/17 0814  BP: (!) 146/73  Pulse: 69  Resp: 16  Temp: 98.6 F (37 C)  SpO2: 95%  Weight: 155 lb 9.6 oz (70.6 kg)  Height: 5\' 4"  (1.626 m)         Assessment & Plan:   Maria Olsen is a 76 y.o. female Essential hypertension  - Borderline. Check home readings then if remains over 140/90, start HCTZ 12.5 mg daily. Orthostatic precautions discussed. Recent blood work reviewed. Plan on recheck creatinine in the next 3-6 months  Chronic obstructive pulmonary disease, unspecified COPD type ([place])  - Overall stable, continue Breo, albuterol if needed, RTC precautions if increased shortness of breath/wheezing.  Plans on shingles vaccine through pharmacy, call if prescription needed.  Meds ordered this encounter  Medications  . hydrochlorothiazide (HYDRODIURIL) 12.5 MG tablet    Sig: Take 1 tablet (12.5  mg total) by mouth daily.    Dispense:  90 tablet    Refill:  1   Patient Instructions   Continue Breo at the same dose, albuterol if needed. If you are experiencing more shortness of breath, or any worsening COPD symptoms, please return to discuss other treatment.  For blood pressure, it was slightly elevated today. Monitor your blood pressures outside of the office and if it remains over 140/90, restart hydrochlorothiazide. Follow-up with me in 6 months unless you restart the medicine, then recheck in 3 months.  Let me know if you need a prescription for shingles vaccine.    IF you received an x-ray today, you will receive an invoice from [facility]. Please contact [facility] at [phone] with questions or concerns regarding your invoice.   IF you received labwork today, you will receive an invoice from [place]. Please contact [facility] at [phone] with questions or concerns regarding your invoice.   Our billing staff will not be able to assist you with questions regarding bills from these companies.  You will be contacted with the lab results as soon as they are available. The fastest way to get your results is to activate your My Chart account. Instructions are located on the last page of this paperwork. If you have not heard from [place] regarding the results in 2 weeks, please contact this office.       I personally performed the services described in this documentation, which was  scribed in my presence. The recorded information has been reviewed and considered for accuracy and completeness, addended by me as needed, and agree with information above.  Signed,   Merri Ray, MD Primary Care at [facility].  03/23/17 9:14 AM

## 2017-03-25 ENCOUNTER — Ambulatory Visit
Admission: RE | Admit: 2017-03-25 | Discharge: 2017-03-25 | Disposition: A | Discharge status: Home / Self Care | Payer: Medicare Other | Source: Ambulatory Visit | Attending: Family Medicine | Admitting: Family Medicine

## 2017-03-25 DIAGNOSIS — Z1231 Encounter for screening mammogram for malignant neoplasm of breast: Secondary | ICD-10-CM

## 2017-03-25 DIAGNOSIS — Z780 Asymptomatic menopausal state: Secondary | ICD-10-CM | POA: Diagnosis not present

## 2017-03-25 DIAGNOSIS — M810 Age-related osteoporosis without current pathological fracture: Secondary | ICD-10-CM | POA: Diagnosis not present

## 2017-05-06 ENCOUNTER — Telehealth: Payer: Self-pay | Admitting: Family Medicine

## 2017-05-06 NOTE — Telephone Encounter (Signed)
Pt calling for Dr Carlota Raspberry to fax a letter to her insurance stating that she does not take spariva  That she only use two inhalers which are Albuteral and Adair Patter fax number [phone] Attn Anderson [place]

## 2017-05-07 NOTE — Telephone Encounter (Signed)
Please see note below and advise  

## 2017-05-08 NOTE — Telephone Encounter (Signed)
Not sure what is needed, but ok to send generic letter stating that she is on Breo as maintenance med albuterol if needed only. She is not on Spiriva at this time.   -JG

## 2017-05-10 NOTE — Telephone Encounter (Signed)
Letter faxed to insurance company.

## 2017-05-21 ENCOUNTER — Encounter: Payer: Self-pay | Admitting: Physician Assistant

## 2017-05-21 ENCOUNTER — Ambulatory Visit (INDEPENDENT_AMBULATORY_CARE_PROVIDER_SITE_OTHER): Payer: Medicare Other | Admitting: Physician Assistant

## 2017-05-21 ENCOUNTER — Other Ambulatory Visit: Payer: Self-pay

## 2017-05-21 VITALS — BP 130/66 | HR 89 | Temp 97.8°F | Resp 16 | Ht 64.0 in | Wt 155.0 lb

## 2017-05-21 DIAGNOSIS — J029 Acute pharyngitis, unspecified: Secondary | ICD-10-CM | POA: Insufficient documentation

## 2017-05-21 DIAGNOSIS — R059 Cough, unspecified: Secondary | ICD-10-CM

## 2017-05-21 DIAGNOSIS — J40 Bronchitis, not specified as acute or chronic: Secondary | ICD-10-CM | POA: Diagnosis not present

## 2017-05-21 DIAGNOSIS — R05 Cough: Secondary | ICD-10-CM

## 2017-05-21 MED ORDER — PREDNISONE 20 MG PO TABS: ORAL_TABLET | ORAL | 0 refills | Status: DC

## 2017-05-21 MED ORDER — AZITHROMYCIN 250 MG PO TABS: ORAL_TABLET | ORAL | 0 refills | Status: DC

## 2017-05-21 NOTE — Progress Notes (Signed)
Maria Olsen  MRN: [id number] DOB: 01/09/1941  PCP: [name], MD  Subjective:  Pt is a 76 year old female PMH HTN and COPD who presents to clinic for worsening cough x 3 days. Endorses headache, cough, hoarse voice. Her cough is productive. She has taken OTC medications. Denies shob, wheezing, fever, chills, night sweats.   Review of Systems  Constitutional: Negative for chills, diaphoresis, fatigue and fever.  HENT: Positive for voice change. Negative for congestion, postnasal drip and rhinorrhea.   Respiratory: Positive for cough. Negative for chest tightness, shortness of breath and wheezing.   Cardiovascular: Negative for chest pain and palpitations.  Psychiatric/Behavioral: Negative for sleep disturbance.    Patient Active Problem List   Diagnosis Date Noted  . COPD (chronic obstructive pulmonary disease) ([place]) 03/23/2017  . HTN (hypertension) 06/30/2013    Current Outpatient Medications on File Prior to Visit  Medication Sig Dispense Refill  . acetaminophen (TYLENOL) 500 MG tablet Take 500 mg by mouth every 6 (six) hours as needed.    Marland Kitchen albuterol (PROVENTIL HFA;VENTOLIN HFA) 108 (90 Base) MCG/ACT inhaler Inhale 1-2 puffs into the lungs every 4 (four) hours as needed for wheezing or shortness of breath. 1 Inhaler 1  . calcium-vitamin D (OSCAL-500) 500-400 MG-UNIT tablet Take 1 tablet by mouth daily.    . fluticasone furoate-vilanterol (BREO ELLIPTA) 100-25 MCG/INH AEPB Inhale 1 puff into the lungs daily. 28 each 5  . ibuprofen (ADVIL,MOTRIN) 200 MG tablet Take 200 mg by mouth every 6 (six) hours as needed.    . Multiple Vitamins-Minerals (MULTIVITAMIN ADULT PO) Take by mouth.    . hydrochlorothiazide (HYDRODIURIL) 12.5 MG tablet Take 1 tablet (12.5 mg total) by mouth daily. (Patient not taking: Reported on 05/21/2017) 90 tablet 1   No current facility-administered medications on file prior to visit.     Allergies  Allergen Reactions  . Demerol [Meperidine]  Nausea And Vomiting  . Doxycycline     Sick on the stomach  . Monocid [Cefonicid] Nausea And Vomiting     Objective:  BP 130/66   Pulse 89   Temp 97.8 F (36.6 C) (Oral)   Resp 16   Ht 5\' 4"  (1.626 m)   Wt 155 lb (70.3 kg)   SpO2 96%   BMI 26.61 kg/m   Physical Exam  Constitutional: She is oriented to person, place, and time and well-developed, well-nourished, and in no distress. No distress.  HENT:  Right Ear: Tympanic membrane normal.  Left Ear: Tympanic membrane normal.  Mouth/Throat: Oropharynx is clear and moist and mucous membranes are normal.  Cardiovascular: Normal rate, regular rhythm and normal heart sounds.  Pulmonary/Chest: Effort normal and breath sounds normal. No respiratory distress. She has no wheezes. She has no rales.  Neurological: She is alert and oriented to person, place, and time. GCS score is 15.  Skin: Skin is warm and dry.  Psychiatric: Mood, memory, affect and judgment normal.  Vitals reviewed.   Assessment and Plan :  1. Bronchitis 2. Cough - azithromycin (ZITHROMAX) 250 MG tablet; Take 2 tabs PO x 1 dose, then 1 tab PO QD x 4 days  Dispense: 6 tablet; Refill: 0 - predniSONE (DELTASONE) 20 MG tablet; Take 3 PO QAM x2days, 2 PO QAM x2days, 1 PO QAM x2days  Dispense: 12 tablet; Refill: 0 - Supportive care encouraged: fluids and rest. RTC in 5-7 days if no improvement.  Mercer Pod, PA-C  Primary Care at [facility] 05/21/2017 9:17 AM

## 2017-05-21 NOTE — Patient Instructions (Addendum)
  Stay well hydrated. Try to drink 32-64 oz water/day. Get lost of rest.  Come back if you are not improving in 5-7 days.   Advil or ibuprofen for pain. Do not take Aspirin.  Throat lozenges (if you are not at risk for choking) or sprays may be used to soothe your throat. Drink enough water and fluids to keep your urine clear or pale yellow. For sore throat: ? Gargle with 8 oz of salt water ( tsp of salt per 1 qt of water) as often as every 1-2 hours to soothe your throat.  Gargle liquid benadryl.  Use Elderberry syrup.   For sore throat try using a honey-based tea. Use 3 teaspoons of honey with juice squeezed from half lemon. Place shaved pieces of ginger into 1/2-1 cup of water and warm over stove top. Then mix the ingredients and repeat every 4 hours as needed.  Cough Syrup Recipe: Sweet Lemon & Honey Thyme  Ingredients a handful of fresh thyme sprigs   1 pint of water (2 cups)  1/2 cup honey (raw is best, but regular will do)  1/2 lemon chopped Instructions 1. Place the lemon in the pint jar and cover with the honey. The honey will macerate the lemons and draw out liquids which taste so delicious! 2. Meanwhile, toss the thyme leaves into a saucepan and cover them with the water. 3. Bring the water to a gentle simmer and reduce it to half, about a cup of tea. 4. When the tea is reduced and cooled a bit, strain the sprigs & leaves, add it into the pint jar and stir it well. 5. Give it a shake and use a spoonful as needed. 6. Store your homemade cough syrup in the refrigerator for about a month.     IF you received an x-ray today, you will receive an invoice from [facility]. Please contact [facility]iology at [phone] with questions or concerns regarding your invoice.   IF you received labwork today, you will receive an invoice from [place]. Please contact [facility] at [phone] with questions or concerns regarding your invoice.   Our billing staff will not be  able to assist you with questions regarding bills from these companies.  You will be contacted with the lab results as soon as they are available. The fastest way to get your results is to activate your My Chart account. Instructions are located on the last page of this paperwork. If you have not heard from [place] regarding the results in 2 weeks, please contact this office.

## 2017-09-07 ENCOUNTER — Other Ambulatory Visit: Payer: Self-pay | Admitting: Family Medicine

## 2017-09-07 DIAGNOSIS — J441 Chronic obstructive pulmonary disease with (acute) exacerbation: Secondary | ICD-10-CM

## 2017-09-08 NOTE — Telephone Encounter (Signed)
LOV 03/23/2017 with Dr. Carlota Raspberry / Refill request for Breo inhaler / Patient has not had a 6 month f/u

## 2017-09-09 NOTE — Telephone Encounter (Signed)
Copied from [place]. Topic: Quick Communication - Rx Refill/Question >> Sep 09, 2017  5:55 PM Oliver Pila B wrote: Medication: fluticasone furoate-vilanterol (BREO ELLIPTA) 100-25 MCG/INH AEPB [[id number]]    Has the patient contacted their pharmacy? Yes.     (Agent: If no, request that the patient contact the pharmacy for the refill.)   Preferred Pharmacy (with phone number or street name): [facility] [facility]   Agent: Please be advised that RX refills may take up to 3 business days. We ask that you follow-up with your pharmacy.

## 2017-09-13 DIAGNOSIS — H2513 Age-related nuclear cataract, bilateral: Secondary | ICD-10-CM | POA: Diagnosis not present

## 2017-09-14 ENCOUNTER — Encounter: Payer: Self-pay | Admitting: Family Medicine

## 2017-09-15 ENCOUNTER — Other Ambulatory Visit: Payer: Self-pay | Admitting: Family Medicine

## 2017-09-15 DIAGNOSIS — J441 Chronic obstructive pulmonary disease with (acute) exacerbation: Secondary | ICD-10-CM

## 2017-09-15 MED ORDER — FLUTICASONE FUROATE-VILANTEROL 100-25 MCG/INH IN AEPB: INHALATION_SPRAY | RESPIRATORY_TRACT | 6 refills | Status: DC

## 2017-09-15 NOTE — Progress Notes (Signed)
See last ov. Continued on Breo - refilled.

## 2017-09-15 NOTE — Telephone Encounter (Signed)
OK to authorize refills until next September? Please advise.

## 2017-11-10 ENCOUNTER — Ambulatory Visit (INDEPENDENT_AMBULATORY_CARE_PROVIDER_SITE_OTHER): Payer: Medicare Other | Admitting: Physician Assistant

## 2017-11-10 ENCOUNTER — Other Ambulatory Visit: Payer: Self-pay

## 2017-11-10 ENCOUNTER — Ambulatory Visit (INDEPENDENT_AMBULATORY_CARE_PROVIDER_SITE_OTHER): Payer: Medicare Other

## 2017-11-10 ENCOUNTER — Encounter: Payer: Self-pay | Admitting: Physician Assistant

## 2017-11-10 VITALS — BP 134/76 | HR 82 | Temp 98.2°F | Resp 16 | Ht 64.0 in | Wt 155.4 lb

## 2017-11-10 DIAGNOSIS — R05 Cough: Secondary | ICD-10-CM

## 2017-11-10 DIAGNOSIS — R059 Cough, unspecified: Secondary | ICD-10-CM

## 2017-11-10 DIAGNOSIS — J209 Acute bronchitis, unspecified: Secondary | ICD-10-CM | POA: Diagnosis not present

## 2017-11-10 MED ORDER — PREDNISONE 20 MG PO TABS: 40.0000 mg | ORAL_TABLET | Freq: Every day | ORAL | 0 refills | Status: AC

## 2017-11-10 MED ORDER — AZITHROMYCIN 250 MG PO TABS: ORAL_TABLET | ORAL | 0 refills | Status: DC

## 2017-11-10 MED ORDER — BENZONATATE 100 MG PO CAPS: 100.0000 mg | ORAL_CAPSULE | Freq: Three times a day (TID) | ORAL | 0 refills | Status: DC | PRN

## 2017-11-10 NOTE — Patient Instructions (Addendum)
Start taking Prednisone '40mg'$  every day for 5 days. Take this with food.  Start taking Mucinex every 12 hours for the next 5-7 days. Drink plenty of water while taking this.  Delsym for night time cough.  If you are not improving in 4-5 days, you may start taking Azithromycin (antibiotic)  Benzonatate (tessalon) is for cough as needed Stay well hydrated. Get lost of rest. Wash your hands often.   Come back if you are not improving in 1 week.   -Foods that can help speed recovery: honey, garlic, chicken soup, elderberries, green tea.  -Supplements that can help speed recovery: vitamin C, zinc, elderberry extract, quercetin, ginseng, selenium -Supplement with prebiotics and probiotics:   Advil or ibuprofen for pain. Do not take Aspirin.  Drink enough water and fluids to keep your urine clear or pale yellow.  For sore throat: ? Gargle with 8 oz of salt water ( tsp of salt per 1 qt of water) as often as every 1-2 hours to soothe your throat.  Gargle liquid benadryl.  Cepacol throat lozenges (if you are not at risk for choking).  For sore throat try using a honey-based tea. Use 3 teaspoons of honey with juice squeezed from half lemon. Place shaved pieces of ginger into 1/2-1 cup of water and warm over stove top. Then mix the ingredients and repeat every 4 hours as needed.  Cough Syrup Recipe: Sweet Lemon & Honey Thyme  Ingredients a handful of fresh thyme sprigs   1 pint of water (2 cups)  1/2 cup honey (raw is best, but regular will do)  1/2 lemon chopped Instructions 1. Place the lemon in the pint jar and cover with the honey. The honey will macerate the lemons and draw out liquids which taste so delicious! 2. Meanwhile, toss the thyme leaves into a saucepan and cover them with the water. 3. Bring the water to a gentle simmer and reduce it to half, about a cup of tea. 4. When the tea is reduced and cooled a bit, strain the sprigs & leaves, add it into the pint jar and stir it  well. 5. Give it a shake and use a spoonful as needed. 6. Store your homemade cough syrup in the refrigerator for about a month.  Is there anything I can do on my own to get rid of my cough? Yes. To help get rid of your cough, you can: ?Use a humidifier in your bedroom ?Use an over-the-counter cough medicine, or suck on cough drops or hard candy ?Stop smoking, if you smoke ?If you have allergies, avoid the things you are allergic to (like pollen, dust, animals, or mold) If you have acid reflux, your doctor or nurse will tell you which lifestyle changes can help reduce symptoms.    Thank you for coming in today. I hope you feel we met your needs.  Feel free to call PCP if you have any questions or further requests.  Please consider signing up for MyChart if you do not already have it, as this is a great way to communicate with me.  Best,  Whitney McVey, PA-C  IF you received an x-ray today, you will receive an invoice from [facility]. Please contact [facility]iology at [phone] with questions or concerns regarding your invoice.   IF you received labwork today, you will receive an invoice from [place]. Please contact [facility] at [phone] with questions or concerns regarding your invoice.   Our billing staff will not be able to assist  you with questions regarding bills from these companies.  You will be contacted with the lab results as soon as they are available. The fastest way to get your results is to activate your My Chart account. Instructions are located on the last page of this paperwork. If you have not heard from [place] regarding the results in 2 weeks, please contact this office.

## 2017-11-10 NOTE — Progress Notes (Signed)
Maria Olsen  MRN: [id number] DOB: 1941-01-17  PCP: [name], MD  Subjective:  Pt is a pleasant 77 year old female PMH COPD who presents to clinic for cough x 5 days.  Symptoms started with sore throat and congestions and have since progressed to cough, which has worsened over the past few days.  ROS below.  She has taken ibuprofen and tylenol.  Of note, her husband passed away last week.    Pt  has a past medical history of Cancer ([place]), COPD (chronic obstructive pulmonary disease) ([place]), and Hypertension.   Review of Systems  Constitutional: Positive for fatigue. Negative for chills, diaphoresis and fever.  HENT: Negative for congestion, postnasal drip, rhinorrhea, sinus pressure and sinus pain.   Respiratory: Positive for cough, chest tightness and shortness of breath. Negative for wheezing.   Cardiovascular: Negative for chest pain and palpitations.    Patient Active Problem List   Diagnosis Date Noted  . Acute pharyngitis 05/21/2017  . COPD (chronic obstructive pulmonary disease) ([place]) 03/23/2017  . HTN (hypertension) 06/30/2013    Current Outpatient Medications on File Prior to Visit  Medication Sig Dispense Refill  . acetaminophen (TYLENOL) 500 MG tablet Take 500 mg by mouth every 6 (six) hours as needed.    Marland Kitchen albuterol (PROVENTIL HFA;VENTOLIN HFA) 108 (90 Base) MCG/ACT inhaler Inhale 1-2 puffs into the lungs every 4 (four) hours as needed for wheezing or shortness of breath. 1 Inhaler 1  . calcium-vitamin D (OSCAL-500) 500-400 MG-UNIT tablet Take 1 tablet by mouth daily.    . fluticasone furoate-vilanterol (BREO ELLIPTA) 100-25 MCG/INH AEPB INHALE 1 PUFF INTO THE LUNGS DAILY 60 each 6  . ibuprofen (ADVIL,MOTRIN) 200 MG tablet Take 200 mg by mouth every 6 (six) hours as needed.    . Multiple Vitamins-Minerals (MULTIVITAMIN ADULT PO) Take by mouth.    . hydrochlorothiazide (HYDRODIURIL) 12.5 MG tablet Take 1 tablet (12.5 mg total) by mouth daily. (Patient not  taking: Reported on 05/21/2017) 90 tablet 1   No current facility-administered medications on file prior to visit.     Allergies  Allergen Reactions  . Demerol [Meperidine] Nausea And Vomiting  . Doxycycline     Sick on the stomach  . Monocid [Cefonicid] Nausea And Vomiting     Objective:  BP 134/76 (BP Location: Left Arm, Patient Position: Sitting, Cuff Size: Normal)   Pulse 82   Temp 98.2 F (36.8 C) (Oral)   Resp 16   Ht 5\' 4"  (1.626 m)   Wt 155 lb 6.4 oz (70.5 kg)   SpO2 92%   BMI 26.67 kg/m   Physical Exam  Constitutional: She is oriented to person, place, and time. No distress.  Cardiovascular: Normal rate, regular rhythm and normal heart sounds.  Pulmonary/Chest: Effort normal and breath sounds normal. No respiratory distress. She has no wheezes. She has no rales.  Neurological: She is alert and oriented to person, place, and time.  Skin: Skin is warm and dry.  Psychiatric: Judgment normal.  Vitals reviewed.   Dg Chest 2 View  Result Date: 11/10/2017 CLINICAL DATA:  77 year old presenting with acute cough. Hypoxemia with oxygen level at 82%. EXAM: CHEST - 2 VIEW COMPARISON:  09/28/2016, 06/30/2013 and earlier. FINDINGS: Cardiomediastinal silhouette unremarkable, unchanged. Stable hyperinflation. Mildly prominent bronchovascular markings diffusely and mild central peribronchial thickening, unchanged. Lungs otherwise clear. No localized airspace consolidation. No pleural effusions. No pneumothorax. Normal pulmonary vascularity. Degenerative changes involving the thoracic and UPPER lumbar spine. Prior LEFT mastectomy and axillary  node dissection. IMPRESSION: 1.  No acute cardiopulmonary disease. 2. Stable hyperinflation and stable mild changes of chronic bronchitis and/or asthma. Electronically Signed   By: Evangeline Dakin M.D.   On: 11/10/2017 11:10    Assessment and Plan :  1. Acute bronchitis, unspecified organism - azithromycin (ZITHROMAX) 250 MG tablet; Take 2 tabs  PO x 1 dose, then 1 tab PO QD x 4 days  Dispense: 6 tablet; Refill: 0 - Pt presents for worsening cough x 5 days. She will start prednisone and mucinex x 5 days. If no improvement, OK to start Azithromycin. RTC if symptoms worsen.  2. Cough - DG Chest 2 View; Future - predniSONE (DELTASONE) 20 MG tablet; Take 2 tablets (40 mg total) by mouth daily with breakfast for 5 days.  Dispense: 10 tablet; Refill: 0 - benzonatate (TESSALON) 100 MG capsule; Take 1-2 capsules (100-200 mg total) by mouth 3 (three) times daily as needed for cough.  Dispense: 40 capsule; Refill: 0   Mercer Pod, PA-C  [facility] 11/10/2017 10:47 AM

## 2017-11-12 ENCOUNTER — Ambulatory Visit (INDEPENDENT_AMBULATORY_CARE_PROVIDER_SITE_OTHER): Payer: Medicare Other | Admitting: Physician Assistant

## 2017-11-12 ENCOUNTER — Other Ambulatory Visit: Payer: Self-pay

## 2017-11-12 ENCOUNTER — Encounter: Payer: Self-pay | Admitting: Physician Assistant

## 2017-11-12 VITALS — BP 122/70 | HR 87 | Temp 97.5°F | Resp 16 | Ht 64.0 in | Wt 156.2 lb

## 2017-11-12 DIAGNOSIS — H9011 Conductive hearing loss, unilateral, right ear, with unrestricted hearing on the contralateral side: Secondary | ICD-10-CM

## 2017-11-12 DIAGNOSIS — H9190 Unspecified hearing loss, unspecified ear: Secondary | ICD-10-CM | POA: Diagnosis not present

## 2017-11-12 NOTE — Progress Notes (Signed)
11/12/2017 9:36 AM   DOB: 08/10/1940 / MRN: [id number]  SUBJECTIVE:  Maria Olsen is a 77 y.o. female presenting for right ear change in hearing.  Patient tells me this is been worsening over the last year.  She describes the hearing changes a muffled sensation.  She tells me that she hears well out of her left ear when trying to listen to the TV but if she turns over in bed she cannot hear as well out of the right side.  She denies any pain, weakness in the extremities, change in speech and difficulty with gait.  She is allergic to demerol [meperidine]; doxycycline; and monocid [cefonicid].   She  has a past medical history of Cancer ([name]), COPD (chronic obstructive pulmonary disease) ([place]), and Hypertension.    She  reports that she has quit smoking. Her smoking use included cigarettes. She has never used smokeless tobacco. She reports that she does not drink alcohol or use drugs. She  has no sexual activity history on file. The patient  has a past surgical history that includes Breast surgery; Cesarean section; Neck surgery; Tubal ligation; and Mastectomy (Left).  Her family history includes Cancer in her brother; Diabetes in her father; Heart disease in her father; Kidney failure in her father; Melanoma in her sister; Mental illness in her brother; Stroke in her father.  Review of Systems  Constitutional: Negative for chills, diaphoresis and fever.  Eyes: Negative.   Respiratory: Negative for cough, hemoptysis, sputum production, shortness of breath and wheezing.   Cardiovascular: Negative for chest pain, orthopnea and leg swelling.  Gastrointestinal: Negative for abdominal pain, blood in stool, constipation, diarrhea, heartburn, melena, nausea and vomiting.  Genitourinary: Negative for dysuria, flank pain, frequency, hematuria and urgency.  Skin: Negative for rash.  Neurological: Negative for dizziness, sensory change, speech change, focal weakness and headaches.    The problem list  and medications were reviewed and updated by myself where necessary and exist elsewhere in the encounter.   OBJECTIVE:  BP 122/70 (BP Location: Left Arm, Patient Position: Sitting, Cuff Size: Normal)   Pulse 87   Temp (!) 97.5 F (36.4 C) (Oral)   Resp 16   Ht 5\' 4"  (1.626 m)   Wt 156 lb 3.2 oz (70.9 kg)   SpO2 95%   BMI 26.81 kg/m   Physical Exam  Constitutional: She is oriented to person, place, and time. She appears well-nourished. No distress.  HENT:  Ears:  Eyes: Pupils are equal, round, and reactive to light. EOM are normal.  Cardiovascular: Normal rate.  Pulmonary/Chest: Effort normal.  Abdominal: She exhibits no distension.  Neurological: She is alert and oriented to person, place, and time. No cranial nerve deficit. Gait normal.  Skin: Skin is dry. She is not diaphoretic.  Psychiatric: She has a normal mood and affect.  Vitals reviewed.   No results found for this or any previous visit (from the past 72 hour(s)).  No results found.  ASSESSMENT AND PLAN:  Margarita was seen today for needs right ear checked, ? ear wax, muffled hearing and can'.  Diagnoses and all orders for this visit:  Perceived hearing changes: Lavaged here.  Patient with improvement in hearing status post lavage.  Advise she apply hydrogen peroxide weekly moving forward.  RTC as needed.   Conductive hearing loss of right ear, unspecified hearing status on contralateral side    The patient is advised to call or return to clinic if she does not see an improvement  in symptoms, or to seek the care of the closest emergency department if she worsens with the above plan.   Philis Fendt, MHS, PA-C [facility] 11/12/2017 9:36 AM

## 2017-11-12 NOTE — Patient Instructions (Signed)
     IF you received an x-ray today, you will receive an invoice from  Radiology. Please contact  Radiology at [phone] with questions or concerns regarding your invoice.   IF you received labwork today, you will receive an invoice from [facility]. Please contact [facility] at [phone] with questions or concerns regarding your invoice.   Our billing staff will not be able to assist you with questions regarding bills from these companies.  You will be contacted with the lab results as soon as they are available. The fastest way to get your results is to activate your My Chart account. Instructions are located on the last page of this paperwork. If you have not heard from us regarding the results in 2 weeks, please contact this office.     

## 2018-01-21 DIAGNOSIS — H35371 Puckering of macula, right eye: Secondary | ICD-10-CM | POA: Diagnosis not present

## 2018-01-21 DIAGNOSIS — H2513 Age-related nuclear cataract, bilateral: Secondary | ICD-10-CM | POA: Diagnosis not present

## 2018-02-24 DIAGNOSIS — H2511 Age-related nuclear cataract, right eye: Secondary | ICD-10-CM | POA: Diagnosis not present

## 2018-02-24 DIAGNOSIS — Z961 Presence of intraocular lens: Secondary | ICD-10-CM | POA: Diagnosis not present

## 2018-02-24 DIAGNOSIS — H2513 Age-related nuclear cataract, bilateral: Secondary | ICD-10-CM | POA: Diagnosis not present

## 2018-02-25 DIAGNOSIS — H25012 Cortical age-related cataract, left eye: Secondary | ICD-10-CM | POA: Diagnosis not present

## 2018-03-17 DIAGNOSIS — H2512 Age-related nuclear cataract, left eye: Secondary | ICD-10-CM | POA: Diagnosis not present

## 2018-03-17 DIAGNOSIS — Z961 Presence of intraocular lens: Secondary | ICD-10-CM | POA: Diagnosis not present

## 2018-03-17 DIAGNOSIS — H2513 Age-related nuclear cataract, bilateral: Secondary | ICD-10-CM | POA: Diagnosis not present

## 2018-03-26 DIAGNOSIS — Z23 Encounter for immunization: Secondary | ICD-10-CM | POA: Diagnosis not present

## 2018-03-28 ENCOUNTER — Telehealth: Payer: Self-pay | Admitting: Family Medicine

## 2018-03-28 ENCOUNTER — Other Ambulatory Visit: Payer: Self-pay | Admitting: Family Medicine

## 2018-03-28 DIAGNOSIS — Z1231 Encounter for screening mammogram for malignant neoplasm of breast: Secondary | ICD-10-CM

## 2018-03-28 NOTE — Telephone Encounter (Signed)
Copied from [place] [id number]. Topic: General - Other >> Mar 28, 2018  9:38 AM Leward Quan A wrote: Reason for CRM: Patient has an appointment scheduled for Physical exam on 04/29/18 at 1.20 p.m. she would like to have blood work done. But due to the time of her appointment she wanted to be sure whether she should fast for her blood work or not. Patient request clarification on whether she need to fast and if so can she come in a few days prior to her visit to have fasting labs done. She stated that an answer to her question can be sent to her Mychart account. Please advise

## 2018-03-29 NOTE — Telephone Encounter (Signed)
Called and informed pt she needs to fast for blood work, advised her that she could come in early if need be to get labs.

## 2018-03-31 ENCOUNTER — Other Ambulatory Visit: Payer: Self-pay

## 2018-03-31 ENCOUNTER — Encounter: Payer: Self-pay | Admitting: Family Medicine

## 2018-03-31 ENCOUNTER — Ambulatory Visit (INDEPENDENT_AMBULATORY_CARE_PROVIDER_SITE_OTHER): Payer: Medicare Other | Admitting: Family Medicine

## 2018-03-31 VITALS — BP 150/80 | HR 67 | Temp 97.6°F | Ht 64.0 in | Wt 157.0 lb

## 2018-03-31 DIAGNOSIS — Z0000 Encounter for general adult medical examination without abnormal findings: Secondary | ICD-10-CM

## 2018-03-31 DIAGNOSIS — M810 Age-related osteoporosis without current pathological fracture: Secondary | ICD-10-CM | POA: Diagnosis not present

## 2018-03-31 DIAGNOSIS — H9193 Unspecified hearing loss, bilateral: Secondary | ICD-10-CM

## 2018-03-31 DIAGNOSIS — I10 Essential (primary) hypertension: Secondary | ICD-10-CM | POA: Diagnosis not present

## 2018-03-31 DIAGNOSIS — J449 Chronic obstructive pulmonary disease, unspecified: Secondary | ICD-10-CM

## 2018-03-31 LAB — COMPREHENSIVE METABOLIC PANEL
ALT: 18 IU/L (ref 0–32)
AST: 21 IU/L (ref 0–40)
Albumin/Globulin Ratio: 1.8 (ref 1.2–2.2)
Albumin: 4.5 g/dL (ref 3.5–4.8)
Alkaline Phosphatase: 84 IU/L (ref 39–117)
BILIRUBIN TOTAL: 0.5 mg/dL (ref 0.0–1.2)
BUN/Creatinine Ratio: 19 (ref 12–28)
BUN: 22 mg/dL (ref 8–27)
CALCIUM: 9.7 mg/dL (ref 8.7–10.3)
CHLORIDE: 100 mmol/L (ref 96–106)
CO2: 25 mmol/L (ref 20–29)
Creatinine, Ser: 1.18 mg/dL — ABNORMAL HIGH (ref 0.57–1.00)
GFR, EST AFRICAN AMERICAN: 52 mL/min/{1.73_m2} — AB (ref >59)
GFR, EST NON AFRICAN AMERICAN: 45 mL/min/{1.73_m2} — AB (ref >59)
GLUCOSE: 92 mg/dL (ref 65–99)
Globulin, Total: 2.5 g/dL (ref 1.5–4.5)
POTASSIUM: 4.5 mmol/L (ref 3.5–5.2)
Sodium: 141 mmol/L (ref 134–144)
TOTAL PROTEIN: 7 g/dL (ref 6.0–8.5)

## 2018-03-31 MED ORDER — FLUTICASONE FUROATE-VILANTEROL 100-25 MCG/INH IN AEPB: INHALATION_SPRAY | RESPIRATORY_TRACT | 6 refills | Status: DC

## 2018-03-31 MED ORDER — ALBUTEROL SULFATE HFA 108 (90 BASE) MCG/ACT IN AERS: 1.0000 | INHALATION_SPRAY | RESPIRATORY_TRACT | 1 refills | Status: DC | PRN

## 2018-03-31 MED ORDER — AMLODIPINE BESYLATE 2.5 MG PO TABS
2.5000 mg | ORAL_TABLET | Freq: Every day | ORAL | 1 refills | Status: DC
Start: 2018-03-31 — End: 2018-12-19

## 2018-03-31 NOTE — Patient Instructions (Addendum)
Keep a record of your blood pressures outside of the office and if those are above 140/90, then start amlodipine.   See handout on meds for osteoporosis.  I would consider a SERM with breast cancer history, with repeat testing next year. Let me know what you would like to do.   I will refer you to audiology to evaluate the hearing further.   Shingles vaccine at your pharmacy.    Preventive Care 23 Years and Older, Female Preventive care refers to lifestyle choices and visits with your health care provider that can promote health and wellness. What does preventive care include?  A yearly physical exam. This is also called an annual well check.  Dental exams once or twice a year.  Routine eye exams. Ask your health care provider how often you should have your eyes checked.  Personal lifestyle choices, including: ? Daily care of your teeth and gums. ? Regular physical activity. ? Eating a healthy diet. ? Avoiding tobacco and drug use. ? Limiting alcohol use. ? Practicing safe sex. ? Taking low-dose aspirin every day. ? Taking vitamin and mineral supplements as recommended by your health care provider. What happens during an annual well check? The services and screenings done by your health care provider during your annual well check will depend on your age, overall health, lifestyle risk factors, and family history of disease. Counseling Your health care provider may ask you questions about your:  Alcohol use.  Tobacco use.  Drug use.  Emotional well-being.  Home and relationship well-being.  Sexual activity.  Eating habits.  History of falls.  Memory and ability to understand (cognition).  Work and work Statistician.  Reproductive health.  Screening You may have the following tests or measurements:  Height, weight, and BMI.  Blood pressure.  Lipid and cholesterol levels. These may be checked every 5 years, or more frequently if you are over 59 years  old.  Skin check.  Lung cancer screening. You may have this screening every year starting at age 89 if you have a 30-pack-year history of smoking and currently smoke or have quit within the past 15 years.  Fecal occult blood test (FOBT) of the stool. You may have this test every year starting at age 60.  Flexible sigmoidoscopy or colonoscopy. You may have a sigmoidoscopy every 5 years or a colonoscopy every 10 years starting at age 62.  Hepatitis C blood test.  Hepatitis B blood test.  Sexually transmitted disease (STD) testing.  Diabetes screening. This is done by checking your blood sugar (glucose) after you have not eaten for a while (fasting). You may have this done every 1-3 years.  Bone density scan. This is done to screen for osteoporosis. You may have this done starting at age 62.  Mammogram. This may be done every 1-2 years. Talk to your health care provider about how often you should have regular mammograms.  Talk with your health care provider about your test results, treatment options, and if necessary, the need for more tests. Vaccines Your health care provider may recommend certain vaccines, such as:  Influenza vaccine. This is recommended every year.  Tetanus, diphtheria, and acellular pertussis (Tdap, Td) vaccine. You may need a Td booster every 10 years.  Varicella vaccine. You may need this if you have not been vaccinated.  Zoster vaccine. You may need this after age 71.  Measles, mumps, and rubella (MMR) vaccine. You may need at least one dose of MMR if you were born in  1957 or later. You may also need a second dose.  Pneumococcal 13-valent conjugate (PCV13) vaccine. One dose is recommended after age 47.  Pneumococcal polysaccharide (PPSV23) vaccine. One dose is recommended after age 23.  Meningococcal vaccine. You may need this if you have certain conditions.  Hepatitis A vaccine. You may need this if you have certain conditions or if you travel or work  in places where you may be exposed to hepatitis A.  Hepatitis B vaccine. You may need this if you have certain conditions or if you travel or work in places where you may be exposed to hepatitis B.  Haemophilus influenzae type b (Hib) vaccine. You may need this if you have certain conditions.  Talk to your health care provider about which screenings and vaccines you need and how often you need them. This information is not intended to replace advice given to you by your health care provider. Make sure you discuss any questions you have with your health care provider. Document Released: 07/19/2015 Document Revised: 03/11/2016 Document Reviewed: 04/23/2015 Elsevier Interactive Patient Education  2018 [employer].   Osteoporosis Osteoporosis is the thinning and loss of density in the bones. Osteoporosis makes the bones more brittle, fragile, and likely to break (fracture). Over time, osteoporosis can cause the bones to become so weak that they fracture after a simple fall. The bones most likely to fracture are the bones in the hip, wrist, and spine. What are the causes? The exact cause is not known. What increases the risk? Anyone can develop osteoporosis. You may be at greater risk if you have a family history of the condition or have poor nutrition. You may also have a higher risk if you are:  Female.  13 years old or older.  A smoker.  Not physically active.  [place] or Asian.  Slender.  What are the signs or symptoms? A fracture might be the first sign of the disease, especially if it results from a fall or injury that would not usually cause a bone to break. Other signs and symptoms include:  Low back and neck pain.  Stooped posture.  Height loss.  How is this diagnosed? To make a diagnosis, your health care provider may:  Take a medical history.  Perform a physical exam.  Order tests, such as: ? A bone mineral density test. ? A dual-energy X-ray absorptiometry  test.  How is this treated? The goal of osteoporosis treatment is to strengthen your bones to reduce your risk of a fracture. Treatment may involve:  Making lifestyle changes, such as: ? Eating a diet rich in calcium. ? Doing weight-bearing and muscle-strengthening exercises. ? Stopping tobacco use. ? Limiting alcohol intake.  Taking medicine to slow the process of bone loss or to increase bone density.  Monitoring your levels of calcium and vitamin D.  Follow these instructions at home:  Include calcium and vitamin D in your diet. Calcium is important for bone health, and vitamin D helps the body absorb calcium.  Perform weight-bearing and muscle-strengthening exercises as directed by your health care provider.  Do not use any tobacco products, including cigarettes, chewing tobacco, and electronic cigarettes. If you need help quitting, ask your health care provider.  Limit your alcohol intake.  Take medicines only as directed by your health care provider.  Keep all follow-up visits as directed by your health care provider. This is important.  Take precautions at home to lower your risk of falling, such as: ? Keeping rooms well lit  and clutter free. ? Installing safety rails on stairs. ? Using rubber mats in the bathroom and other areas that are often wet or slippery. Get help right away if: You fall or injure yourself. This information is not intended to replace advice given to you by your health care provider. Make sure you discuss any questions you have with your health care provider. Document Released: 04/01/2005 Document Revised: 11/25/2015 Document Reviewed: 11/30/2013 Elsevier Interactive Patient Education  [employer].   If you have lab work done today you will be contacted with your lab results within the next 2 weeks.  If you have not heard from [place] then please contact us. The fastest way to get your results is to register for My Chart.   IF you received an  x-ray today, you will receive an invoice from [facility] Rad[facility]. Please contact [facility] Radiology at [phone] with questions or concerns regarding your invoice.   IF you received labwork today, you will receive an invoice from [facility]. Please contact [facility] at [phone] with questions or concerns regarding your invoice.   Our billing staff will not be able to assist you with questions regarding bills from these companies.  You will be contacted with the lab results as soon as they are available. The fastest way to get your results is to activate your My Chart account. Instructions are located on the last page of this paperwork. If you have not heard from [place] regarding the results in 2 weeks, please contact this office.

## 2018-03-31 NOTE — Progress Notes (Signed)
Subjective:  By signing my name below, I, Maria Olsen, attest that this documentation has been prepared under the direction and in the presence of [name], MD Electronically Signed: Ladene Artist, ED Scribe 03/31/2018 at 8:54 AM.   Patient ID: Maria Olsen, female    DOB: Sep 22, 1940, 77 y.o.   MRN: [id number]  Chief Complaint  Patient presents with  . Annual Exam    CPE   HPI Maria Olsen is a 77 y.o. female who presents to Primary Care at [facility] for an annual exam. H/o HTN and COPD. Pt is fasting at this OV.  COPD Taking Breo, albuterol prn. Treated for acute bronchitis in May. - Pt is using Breo 1 puff daily. Doesn't use albuterol very often, does request a refill at this time. States symptoms are unchanged.  HTN Lab Results  Component Value Date   CREATININE 1.02 (H) 03/12/2017   BP Readings from Last 3 Encounters:  03/31/18 (!) 146/71  11/12/17 122/70  11/10/17 134/76  H/o HTN. Stopped meds as had been controlled off meds. Prev took HCTZ 12.5 mg. - Pt states she hasn't been checking her BP lately. Reports she never filled the prescription. She does report feeling dizzy and "seeing stars" while taking HCTZ 12.5 mg in the past. Pt has never tried another BP medication.  CA Screening Colonoscopy: 2012 then planned again in 2017; pt states she was told she didn't need another colonoscopy Breast CA Screening: h/o breast CA, last mammogram 03/2017, next scheduled for 10/21 Cervical CA Screening: prev pap testing, normal after age 45 Bone Density: 03/25/17, osteoporosis with T score -2.5. Recommended to take calcium and Vit D in the past  H/o Breast CA With L mastectomy. Last mammogram 03/2017, no suspicious findings of R breast. Recommended yearly rpt. Scheduled for 10/21.  Immunizations Immunization History  Administered Date(s) Administered  . Influenza Split 06/08/2012  . Influenza,inj,Quad PF,6+ Mos 03/12/2017  . Influenza-Unspecified 04/13/2016, 03/26/2018    . Pneumococcal Conjugate-13 05/21/2014  . Pneumococcal Polysaccharide-23 10/16/2015   Fall Screening No falls within the past yr.  Depression Screening Depression screen [facility] 2/9 03/31/2018 11/12/2017 11/10/2017 05/21/2017 03/12/2017  Decreased Interest 0 0 0 0 0  Down, Depressed, Hopeless 0 0 0 0 0  PHQ - 2 Score 0 0 0 0 0   Functional Status Survey: Is the patient deaf or have difficulty hearing?: Yes Does the patient have difficulty seeing, even when wearing glasses/contacts?: No Does the patient have difficulty concentrating, remembering, or making decisions?: No Does the patient have difficulty walking or climbing stairs?: No Does the patient have difficulty dressing or bathing?: No Does the patient have difficulty doing errands alone such as visiting a doctor's office or shopping?: No Hearing: Pt reports decreased hearing improved after she had her hearing irrigated. Reports she has difficulty hearing men voices well. She had a screening test done at the [facility] ~8 months ago, prior to ear irrigation.  Mental Status Screening 6CIT Screen 03/31/2018 03/12/2017  What Year? 0 points 0 points  What month? 0 points 0 points  What time? 0 points 0 points  Count back from 20 0 points 0 points  Months in reverse 0 points 0 points  Repeat phrase 0 points 0 points  Total Score 0 0     Visual Acuity Screening   Right eye Left eye Both eyes  Without correction: 20/50 20/20 20/30  With correction:      Vision: last saw optho 2 wks ago  Dentist: followed every 6 months Exercise: walks 3 days/wk  Advanced Directives Has a HCPOA and living will.  Patient Active Problem List   Diagnosis Date Noted  . Acute pharyngitis 05/21/2017  . COPD (chronic obstructive pulmonary disease) ([place]) 03/23/2017  . HTN (hypertension) 06/30/2013   Past Medical History:  Diagnosis Date  . Cancer ([place])   . COPD (chronic obstructive pulmonary disease) ([place])   . Hypertension    Past Surgical History:   Procedure Laterality Date  . BREAST SURGERY    . CESAREAN SECTION    . MASTECTOMY Left   . NECK SURGERY    . TUBAL LIGATION     Allergies  Allergen Reactions  . Demerol [Meperidine] Nausea And Vomiting  . Doxycycline     Sick on the stomach  . Monocid [Cefonicid] Nausea And Vomiting   Prior to Admission medications   Medication Sig Start Date End Date Taking? Authorizing Provider  acetaminophen (TYLENOL) 500 MG tablet Take 500 mg by mouth every 6 (six) hours as needed.   Yes [provider]  albuterol (PROVENTIL HFA;VENTOLIN HFA) 108 (90 Base) MCG/ACT inhaler Inhale 1-2 puffs into the lungs every 4 (four) hours as needed for wheezing or shortness of breath. 08/18/16  Yes [name], MD  calcium-vitamin D (OSCAL-500) 500-400 MG-UNIT tablet Take 1 tablet by mouth daily.   Yes [provider]  fluticasone furoate-vilanterol (BREO ELLIPTA) 100-25 MCG/INH AEPB INHALE 1 PUFF INTO THE LUNGS DAILY 09/15/17  Yes [name], MD  ibuprofen (ADVIL,MOTRIN) 200 MG tablet Take 200 mg by mouth every 6 (six) hours as needed.   Yes [provider]  Multiple Vitamins-Minerals (MULTIVITAMIN ADULT PO) Take by mouth.   Yes [provider]   Social History   Socioeconomic History  . Marital status: Widowed    Spouse name: Not on file  . Number of children: Not on file  . Years of education: Not on file  . Highest education level: Not on file  Occupational History  . Occupation: Therapist, sports  Social Needs  . Financial resource strain: Not on file  . Food insecurity:    Worry: Not on file    Inability: Not on file  . Transportation needs:    Medical: Not on file    Non-medical: Not on file  Tobacco Use  . Smoking status: Former Smoker    Types: Cigarettes  . Smokeless tobacco: Never Used  Substance and Sexual Activity  . Alcohol use: No    Alcohol/week: 0.0 standard drinks    Comment: rare  . Drug use: No  . Sexual activity: Not on file  Lifestyle  .  Physical activity:    Days per week: Not on file    Minutes per session: Not on file  . Stress: Not on file  Relationships  . Social connections:    Talks on phone: Not on file    Gets together: Not on file    Attends religious service: Not on file    Active member of club or organization: Not on file    Attends meetings of clubs or organizations: Not on file    Relationship status: Not on file  . Intimate partner violence:    Fear of current or ex partner: Not on file    Emotionally abused: Not on file    Physically abused: Not on file    Forced sexual activity: Not on file  Other Topics Concern  . Not on file  Social History Narrative  Married   Education: Secretary/administrator   Exercise: some   Review of Systems  HENT: Positive for hearing loss.   Respiratory: Positive for shortness of breath (with exertion, h/o COPD).   Allergic/Immunologic: Positive for environmental allergies.  neg other than above    Objective:   Physical Exam  Constitutional: She is oriented to person, place, and time. She appears well-developed and well-nourished.  HENT:  Head: Normocephalic and atraumatic.  Right Ear: External ear normal.  Left Ear: External ear normal.  Mouth/Throat: Oropharynx is clear and moist.  TMs: Narrow canals but TMs appear pearly grey  Eyes: Pupils are equal, round, and reactive to light. Conjunctivae are normal.  Neck: Normal range of motion. Neck supple. No thyromegaly present.  Cardiovascular: Normal rate, regular rhythm, normal heart sounds and intact distal pulses.  No murmur heard. Pulmonary/Chest: Effort normal and breath sounds normal. No respiratory distress. She has no wheezes.  Abdominal: Soft. Bowel sounds are normal. There is no tenderness.  Musculoskeletal: Normal range of motion. She exhibits no edema or tenderness.  Lymphadenopathy:    She has no cervical adenopathy.  Neurological: She is alert and oriented to person, place, and time.  Skin: Skin is warm and  dry. No rash noted.  Psychiatric: She has a normal mood and affect. Her behavior is normal. Thought content normal.  Vitals reviewed.  Vitals:   03/31/18 0829 03/31/18 0847  BP: (!) 146/71 (!) 150/80  Pulse: 67   Temp: 97.6 F (36.4 C)   TempSrc: Oral   SpO2: 96%   Weight: 157 lb (71.2 kg)   Height: _0  (1.626 m)       Assessment & Plan:    JULIANE GUEST is a 77 y.o. female Medicare annual wellness visit, subsequent  - anticipatory guidance as below in AVS, screening labs if needed. Health maintenance items as above in HPI discussed/recommended as applicable.   - no concerning responses on depression, fall, or functional status screening. Any positive responses noted as above. Advanced directives discussed as in CHL.   Chronic obstructive pulmonary disease, unspecified COPD type ([place]) Plan: fluticasone furoate-vilanterol (BREO ELLIPTA) 100-25 MCG/INH AEPB, albuterol (PROVENTIL HFA;VENTOLIN HFA) 108 (90 Base) MCG/ACT inhaler  - overall stable. Continue Breo daily with albuterol as needed  Essential hypertension - Plan: Comprehensive metabolic panel, amLODipine (NORVASC) 2.5 MG tablet  -Persistently mildly elevated readings.  Some side effects with HCTZ in the past, may have been too much medication.  Will start with low-dose amlodipine if home readings been elevated, recheck within 6 months.   Age-related osteoporosis without current pathological fracture  -Continue calcium, vitamin D.  Would consider SERM with history of breast cancer.  Handout given on treatment options, and she can contact me when she has made decision.  Repeat bone density 2020  Bilateral hearing loss, unspecified hearing loss type - Plan: Ambulatory referral to Audiology  -Possible age-related hearing loss, refer to audiology.   Meds ordered this encounter  Medications  . amLODipine (NORVASC) 2.5 MG tablet    Sig: Take 1 tablet (2.5 mg total) by mouth daily.    Dispense:  90 tablet    Refill:  1    Patient Instructions    Keep a record of your blood pressures outside of the office and if those are above 140/90, then start amlodipine.   See handout on meds for osteoporosis.  I would consider a SERM with breast cancer history, with repeat testing next year. Let me know what you would like  to do.   I will refer you to audiology to evaluate the hearing further.   Shingles vaccine at your pharmacy.    Preventive Care 75 Years and Older, Female Preventive care refers to lifestyle choices and visits with your health care provider that can promote health and wellness. What does preventive care include?  A yearly physical exam. This is also called an annual well check.  Dental exams once or twice a year.  Routine eye exams. Ask your health care provider how often you should have your eyes checked.  Personal lifestyle choices, including: ? Daily care of your teeth and gums. ? Regular physical activity. ? Eating a healthy diet. ? Avoiding tobacco and drug use. ? Limiting alcohol use. ? Practicing safe sex. ? Taking low-dose aspirin every day. ? Taking vitamin and mineral supplements as recommended by your health care provider. What happens during an annual well check? The services and screenings done by your health care provider during your annual well check will depend on your age, overall health, lifestyle risk factors, and family history of disease. Counseling Your health care provider may ask you questions about your:  Alcohol use.  Tobacco use.  Drug use.  Emotional well-being.  Home and relationship well-being.  Sexual activity.  Eating habits.  History of falls.  Memory and ability to understand (cognition).  Work and work Statistician.  Reproductive health.  Screening You may have the following tests or measurements:  Height, weight, and BMI.  Blood pressure.  Lipid and cholesterol levels. These may be checked every 5 years, or more frequently if  you are over 64 years old.  Skin check.  Lung cancer screening. You may have this screening every year starting at age 20 if you have a 30-pack-year history of smoking and currently smoke or have quit within the past 15 years.  Fecal occult blood test (FOBT) of the stool. You may have this test every year starting at age 55.  Flexible sigmoidoscopy or colonoscopy. You may have a sigmoidoscopy every 5 years or a colonoscopy every 10 years starting at age 49.  Hepatitis C blood test.  Hepatitis B blood test.  Sexually transmitted disease (STD) testing.  Diabetes screening. This is done by checking your blood sugar (glucose) after you have not eaten for a while (fasting). You may have this done every 1-3 years.  Bone density scan. This is done to screen for osteoporosis. You may have this done starting at age 82.  Mammogram. This may be done every 1-2 years. Talk to your health care provider about how often you should have regular mammograms.  Talk with your health care provider about your test results, treatment options, and if necessary, the need for more tests. Vaccines Your health care provider may recommend certain vaccines, such as:  Influenza vaccine. This is recommended every year.  Tetanus, diphtheria, and acellular pertussis (Tdap, Td) vaccine. You may need a Td booster every 10 years.  Varicella vaccine. You may need this if you have not been vaccinated.  Zoster vaccine. You may need this after age 21.  Measles, mumps, and rubella (MMR) vaccine. You may need at least one dose of MMR if you were born in 1957 or later. You may also need a second dose.  Pneumococcal 13-valent conjugate (PCV13) vaccine. One dose is recommended after age 75.  Pneumococcal polysaccharide (PPSV23) vaccine. One dose is recommended after age 27.  Meningococcal vaccine. You may need this if you have certain conditions.  Hepatitis A  vaccine. You may need this if you have certain conditions or  if you travel or work in places where you may be exposed to hepatitis A.  Hepatitis B vaccine. You may need this if you have certain conditions or if you travel or work in places where you may be exposed to hepatitis B.  Haemophilus influenzae type b (Hib) vaccine. You may need this if you have certain conditions.  Talk to your health care provider about which screenings and vaccines you need and how often you need them. This information is not intended to replace advice given to you by your health care provider. Make sure you discuss any questions you have with your health care provider. Document Released: 07/19/2015 Document Revised: 03/11/2016 Document Reviewed: 04/23/2015 Elsevier Interactive Patient Education  2018 [employer].   Osteoporosis Osteoporosis is the thinning and loss of density in the bones. Osteoporosis makes the bones more brittle, fragile, and likely to break (fracture). Over time, osteoporosis can cause the bones to become so weak that they fracture after a simple fall. The bones most likely to fracture are the bones in the hip, wrist, and spine. What are the causes? The exact cause is not known. What increases the risk? Anyone can develop osteoporosis. You may be at greater risk if you have a family history of the condition or have poor nutrition. You may also have a higher risk if you are:  Female.  13 years old or older.  A smoker.  Not physically active.  [place] or Asian.  Slender.  What are the signs or symptoms? A fracture might be the first sign of the disease, especially if it results from a fall or injury that would not usually cause a bone to break. Other signs and symptoms include:  Low back and neck pain.  Stooped posture.  Height loss.  How is this diagnosed? To make a diagnosis, your health care provider may:  Take a medical history.  Perform a physical exam.  Order tests, such as: ? A bone mineral density test. ? A dual-energy  X-ray absorptiometry test.  How is this treated? The goal of osteoporosis treatment is to strengthen your bones to reduce your risk of a fracture. Treatment may involve:  Making lifestyle changes, such as: ? Eating a diet rich in calcium. ? Doing weight-bearing and muscle-strengthening exercises. ? Stopping tobacco use. ? Limiting alcohol intake.  Taking medicine to slow the process of bone loss or to increase bone density.  Monitoring your levels of calcium and vitamin D.  Follow these instructions at home:  Include calcium and vitamin D in your diet. Calcium is important for bone health, and vitamin D helps the body absorb calcium.  Perform weight-bearing and muscle-strengthening exercises as directed by your health care provider.  Do not use any tobacco products, including cigarettes, chewing tobacco, and electronic cigarettes. If you need help quitting, ask your health care provider.  Limit your alcohol intake.  Take medicines only as directed by your health care provider.  Keep all follow-up visits as directed by your health care provider. This is important.  Take precautions at home to lower your risk of falling, such as: ? Keeping rooms well lit and clutter free. ? Installing safety rails on stairs. ? Using rubber mats in the bathroom and other areas that are often wet or slippery. Get help right away if: You fall or injure yourself. This information is not intended to replace advice given to you by your health care  provider. Make sure you discuss any questions you have with your health care provider. Document Released: 04/01/2005 Document Revised: 11/25/2015 Document Reviewed: 11/30/2013 Elsevier Interactive Patient Education  [employer].   If you have lab work done today you will be contacted with your lab results within the next 2 weeks.  If you have not heard from [place] then please contact us. The fastest way to get your results is to register for My  Chart.   IF you received an x-ray today, you will receive an invoice from [facility]. Please contact [facility] at [phone] with questions or concerns regarding your invoice.   IF you received labwork today, you will receive an invoice from [facility]. Please contact [facility] at [phone] with questions or concerns regarding your invoice.   Our billing staff will not be able to assist you with questions regarding bills from these companies.  You will be contacted with the lab results as soon as they are available. The fastest way to get your results is to activate your My Chart account. Instructions are located on the last page of this paperwork. If you have not heard from [place] regarding the results in 2 weeks, please contact this office.

## 2018-04-13 ENCOUNTER — Telehealth: Payer: Self-pay | Admitting: Pediatrics

## 2018-04-13 ENCOUNTER — Telehealth: Payer: Self-pay | Admitting: Family Medicine

## 2018-04-13 NOTE — Telephone Encounter (Signed)
Please adv

## 2018-04-13 NOTE — Telephone Encounter (Signed)
More info please - did not see any specific question.

## 2018-04-28 ENCOUNTER — Ambulatory Visit
Admission: RE | Admit: 2018-04-28 | Discharge: 2018-04-28 | Disposition: A | Discharge status: Home / Self Care | Payer: Medicare Other | Source: Ambulatory Visit | Attending: Family Medicine | Admitting: Family Medicine

## 2018-04-28 DIAGNOSIS — Z1231 Encounter for screening mammogram for malignant neoplasm of breast: Secondary | ICD-10-CM

## 2018-04-29 ENCOUNTER — Encounter: Payer: Medicare Other | Admitting: Family Medicine

## 2018-07-04 DIAGNOSIS — H04123 Dry eye syndrome of bilateral lacrimal glands: Secondary | ICD-10-CM | POA: Diagnosis not present

## 2018-09-26 ENCOUNTER — Ambulatory Visit: Payer: Medicare Other | Admitting: Family Medicine

## 2018-12-12 ENCOUNTER — Encounter: Payer: Self-pay | Admitting: Family Medicine

## 2018-12-12 ENCOUNTER — Other Ambulatory Visit: Payer: Self-pay

## 2018-12-12 DIAGNOSIS — J449 Chronic obstructive pulmonary disease, unspecified: Secondary | ICD-10-CM

## 2018-12-12 MED ORDER — FLUTICASONE FUROATE-VILANTEROL 100-25 MCG/INH IN AEPB: INHALATION_SPRAY | RESPIRATORY_TRACT | 6 refills | Status: DC

## 2018-12-13 ENCOUNTER — Other Ambulatory Visit: Payer: Self-pay

## 2018-12-13 DIAGNOSIS — J449 Chronic obstructive pulmonary disease, unspecified: Secondary | ICD-10-CM

## 2018-12-13 MED ORDER — FLUTICASONE FUROATE-VILANTEROL 100-25 MCG/INH IN AEPB: INHALATION_SPRAY | RESPIRATORY_TRACT | 6 refills | Status: DC

## 2018-12-13 NOTE — Telephone Encounter (Signed)
error 

## 2018-12-19 ENCOUNTER — Other Ambulatory Visit: Payer: Self-pay

## 2018-12-19 ENCOUNTER — Telehealth (INDEPENDENT_AMBULATORY_CARE_PROVIDER_SITE_OTHER): Payer: Medicare Other | Admitting: Family Medicine

## 2018-12-19 DIAGNOSIS — R7989 Other specified abnormal findings of blood chemistry: Secondary | ICD-10-CM | POA: Diagnosis not present

## 2018-12-19 DIAGNOSIS — J449 Chronic obstructive pulmonary disease, unspecified: Secondary | ICD-10-CM

## 2018-12-19 DIAGNOSIS — I10 Essential (primary) hypertension: Secondary | ICD-10-CM

## 2018-12-19 MED ORDER — AMLODIPINE BESYLATE 2.5 MG PO TABS: 2.5000 mg | ORAL_TABLET | Freq: Every day | ORAL | 1 refills | Status: DC

## 2018-12-19 NOTE — Progress Notes (Signed)
CC-Medication refill-Patient stated She is doing well with no complaints at this time.Patient stated she need a refill on her amlodipine 2.5 mg. Patient has been taking at home bp. Mon 12/12/18 162/77,   12/13/18-113/83,  12/14/18-147/76,   12/15/18-102/61,  12/16/18-120/60,  12/17/18-115/63,  12/18/18-133/63,  12/19/18-132/61

## 2018-12-19 NOTE — Progress Notes (Signed)
Virtual Visit via Telephone Note  I connected with Maria Olsen on 12/19/18 at 4:08 PM by telephone and verified that I am speaking with the correct person using two identifiers.   I discussed the limitations, risks, security and privacy concerns of performing an evaluation and management service by telephone and the availability of in person appointments. I also discussed with the patient that there may be a patient responsible charge related to this service. The patient expressed understanding and agreed to proceed, consent obtained  Chief complaint:  HTN  History of Present Illness: Maria Olsen is a 78 y.o. female  Hypertension: BP Readings from Last 3 Encounters:  03/31/18 (!) 150/80  11/12/17 122/70  11/10/17 134/76   Lab Results  Component Value Date   CREATININE 1.18 (H) 03/31/2018  Currently on amlodipine 2.5 mg daily.  EGFR: 45 in 03/2018.  No nsaids.  Could drink more fluids.  Plans on bloodwork.   Home readings: Past 7 days: 102-162/60-83. Usually in 110's over 60-70.  Constitutional: Negative for fatigue and unexpected weight change.  Eyes: Negative for visual disturbance.  Respiratory: Negative for cough, chest tightness and shortness of breath.   Cardiovascular: Negative for chest pain, palpitations and leg swelling.  Gastrointestinal: Negative for abdominal pain and blood in stool.  Neurological: Negative for dizziness, light-headedness and headaches.   COPD: Uses Breo Ellipta 100/25 mcg inhalation daily, albuterol as needed. Doing fairly well.  Mucus production past few months. Clear - [place]. No recent changes. Would like to remain on same regimen.  No fevers.  [place] around home. Watching 80yo grandson on some mornings.    Patient Active Problem List   Diagnosis Date Noted  . Acute pharyngitis 05/21/2017  . COPD (chronic obstructive pulmonary disease) ([place]) 03/23/2017  . HTN (hypertension) 06/30/2013   Past  Medical History:  Diagnosis Date  . Cancer ([place])   . COPD (chronic obstructive pulmonary disease) ([place])   . Hypertension    Past Surgical History:  Procedure Laterality Date  . BREAST SURGERY    . CESAREAN SECTION    . MASTECTOMY Left   . NECK SURGERY    . TUBAL LIGATION     Allergies  Allergen Reactions  . Demerol [Meperidine] Nausea And Vomiting  . Doxycycline     Sick on the stomach  . Monocid [Cefonicid] Nausea And Vomiting   Prior to Admission medications   Medication Sig Start Date End Date Taking? Authorizing Provider  acetaminophen (TYLENOL) 500 MG tablet Take 500 mg by mouth every 6 (six) hours as needed.   Yes [provider]  albuterol (PROVENTIL HFA;VENTOLIN HFA) 108 (90 Base) MCG/ACT inhaler Inhale 1-2 puffs into the lungs every 4 (four) hours as needed for wheezing or shortness of breath. 03/31/18  Yes [name], MD  amLODipine (NORVASC) 2.5 MG tablet Take 1 tablet (2.5 mg total) by mouth daily. 03/31/18  Yes [name], MD  calcium-vitamin D (OSCAL-500) 500-400 MG-UNIT tablet Take 1 tablet by mouth daily.   Yes [provider]  fluticasone furoate-vilanterol (BREO ELLIPTA) 100-25 MCG/INH AEPB INHALE 1 PUFF INTO THE LUNGS DAILY 12/13/18  Yes [name], MD  ibuprofen (ADVIL,MOTRIN) 200 MG tablet Take 200 mg by mouth every 6 (six) hours as needed.   Yes [provider]  Multiple Vitamins-Minerals (MULTIVITAMIN ADULT PO) Take by mouth.   Yes [provider]   Social History   Socioeconomic History  . Marital status: Widowed  Spouse name: Not on file  . Number of children: Not on file  . Years of education: Not on file  . Highest education level: Not on file  Occupational History  . Occupation: Therapist, sports  Social Needs  . Financial resource strain: Not on file  . Food insecurity    Worry: Not on file    Inability: Not on file  . Transportation needs    Medical: Not on file    Non-medical: Not on file  Tobacco  Use  . Smoking status: Former Smoker    Types: Cigarettes  . Smokeless tobacco: Never Used  Substance and Sexual Activity  . Alcohol use: No    Alcohol/week: 0.0 standard drinks    Comment: rare  . Drug use: No  . Sexual activity: Not on file  Lifestyle  . Physical activity    Days per week: Not on file    Minutes per session: Not on file  . Stress: Not on file  Relationships  . Social Herbalist on phone: Not on file    Gets together: Not on file    Attends religious service: Not on file    Active member of club or organization: Not on file    Attends meetings of clubs or organizations: Not on file    Relationship status: Not on file  . Intimate partner violence    Fear of current or ex partner: Not on file    Emotionally abused: Not on file    Physically abused: Not on file    Forced sexual activity: Not on file  Other Topics Concern  . Not on file  Social History Narrative   Married   Education: College   Exercise: some     Observations/Objective: No distress.  Speaking normally, full sentences.  Understanding expressed of plan with all questions answered  Assessment and Plan: Elevated serum creatinine - Plan: Basic metabolic panel,  Essential hypertension - Plan: amLODipine (NORVASC) 2.5 MG tablet, Basic metabolic panel,   -Stable overall with home readings on amlodipine.  Tolerating current dose.  Continue same.  Did have a slight decline in her GFR over the past few years.  Will repeat BMP, maintain hydration, avoid NSAIDs.  Chronic obstructive pulmonary disease, unspecified COPD type ([place]) - Plan:   -Some increased mucus production past few months, but denies acute changes.  Option of meeting with pulmonary to review regimen, but would like to remain on same treatment for now.  Continue Breo and albuterol if needed.  Follow Up Instructions: 6 months.    I discussed the assessment and treatment plan with the patient. The patient was provided an  opportunity to ask questions and all were answered. The patient agreed with the plan and demonstrated an understanding of the instructions.   The patient was advised to call back or seek an in-person evaluation if the symptoms worsen or if the condition fails to improve as anticipated.  I provided 12 minutes of non-face-to-face time during this encounter.  Signed,   Merri Ray, MD [facility].  12/19/18

## 2018-12-19 NOTE — Patient Instructions (Addendum)
No change in amlodipine based on home readings.  Follow-up with me in 6 months for repeat evaluation, but lab only visit within the next week.  If any persistent mucus production or worsening mucus production, I would consider meeting with pulmonary to decide if changes to your COPD medication regimen.  Okay to remain the same meds for now.   I will repeat your kidney function test as it has increased slightly in September.  Make sure to avoid NSAIDs and stay hydrated.  Let me know if there are questions.     If you have lab work done today you will be contacted with your lab results within the next 2 weeks.  If you have not heard from [place] then please contact us. The fastest way to get your results is to register for My Chart.   IF you received an x-ray today, you will receive an invoice from [facility]. Please contact [facility] at [phone] with questions or concerns regarding your invoice.   IF you received labwork today, you will receive an invoice from [facility]. Please contact [facility] at [phone] with questions or concerns regarding your invoice.   Our billing staff will not be able to assist you with questions regarding bills from these companies.  You will be contacted with the lab results as soon as they are available. The fastest way to get your results is to activate your My Chart account. Instructions are located on the last page of this paperwork. If you have not heard from [place] regarding the results in 2 weeks, please contact this office.

## 2018-12-26 ENCOUNTER — Other Ambulatory Visit: Payer: Self-pay

## 2018-12-26 ENCOUNTER — Ambulatory Visit (INDEPENDENT_AMBULATORY_CARE_PROVIDER_SITE_OTHER): Payer: Medicare Other | Admitting: Emergency Medicine

## 2018-12-26 DIAGNOSIS — I10 Essential (primary) hypertension: Secondary | ICD-10-CM

## 2018-12-26 DIAGNOSIS — R7989 Other specified abnormal findings of blood chemistry: Secondary | ICD-10-CM

## 2018-12-27 LAB — BASIC METABOLIC PANEL
BUN/Creatinine Ratio: 23 (ref 12–28)
BUN: 26 mg/dL (ref 8–27)
CO2: 22 mmol/L (ref 20–29)
Calcium: 9.4 mg/dL (ref 8.7–10.3)
Chloride: 105 mmol/L (ref 96–106)
Creatinine, Ser: 1.13 mg/dL — ABNORMAL HIGH (ref 0.57–1.00)
GFR calc Af Amer: 55 mL/min/{1.73_m2} — ABNORMAL LOW (ref >59)
GFR calc non Af Amer: 47 mL/min/{1.73_m2} — ABNORMAL LOW (ref >59)
Glucose: 90 mg/dL (ref 65–99)
Potassium: 4.8 mmol/L (ref 3.5–5.2)
Sodium: 142 mmol/L (ref 134–144)

## 2019-03-09 ENCOUNTER — Other Ambulatory Visit: Payer: Self-pay

## 2019-03-09 DIAGNOSIS — Z20822 Contact with and (suspected) exposure to covid-19: Secondary | ICD-10-CM

## 2019-03-09 DIAGNOSIS — R6889 Other general symptoms and signs: Secondary | ICD-10-CM | POA: Diagnosis not present

## 2019-03-10 LAB — NOVEL CORONAVIRUS, NAA: SARS-CoV-2, NAA: NOT DETECTED

## 2019-04-03 ENCOUNTER — Ambulatory Visit (INDEPENDENT_AMBULATORY_CARE_PROVIDER_SITE_OTHER): Payer: Medicare Other | Admitting: Family Medicine

## 2019-04-03 VITALS — BP 118/66 | Ht 64.0 in | Wt 157.0 lb

## 2019-04-03 DIAGNOSIS — Z0000 Encounter for general adult medical examination without abnormal findings: Secondary | ICD-10-CM | POA: Diagnosis not present

## 2019-04-03 NOTE — Progress Notes (Signed)
Presents today for [employer] Visit   Date of last exam: 12/26/2018  Interpreter used for this visit?  No  I connected with  Maria Olsen on 04/03/19 by a telephone enabled  and verified that I am speaking with the correct person using two identifiers.   I discussed the limitations of evaluation and management by telemedicine. The patient expressed understanding and agreed to proceed.     Patient Care Team: [name], MD as PCP - General (Family Medicine) [name], MD (Family Medicine) [place] Comment, My [place], [place] as Referring Physician [facility])   Other items to address today:   Discussed immunizations Discussed Eye/Dental Follow up scheduled Dr. Carlota Raspberry 06-21-2019 8;20am   Other Screening:  Last lipid screening: 03/12/2017 ADVANCE DIRECTIVES: Discussed:yes On File: no copy requested Materials Provided: no  Immunization status:  Immunization History  Administered Date(s) Administered  . Influenza Split 06/08/2012  . Influenza,inj,Quad PF,6+ Mos 03/12/2017  . Influenza-Unspecified 04/13/2016, 03/26/2018  . Pneumococcal Conjugate-13 05/21/2014  . Pneumococcal Polysaccharide-23 10/16/2015     Health Maintenance Due  Topic Date Due  . INFLUENZA VACCINE  02/04/2019     Functional Status Survey: Is the patient deaf or have difficulty hearing?: Yes(hearing aids in both ears) Does the patient have difficulty seeing, even when wearing glasses/contacts?: No Does the patient have difficulty concentrating, remembering, or making decisions?: No Does the patient have difficulty walking or climbing stairs?: No Does the patient have difficulty dressing or bathing?: No Does the patient have difficulty doing errands alone such as visiting a doctor's office or shopping?: No   6CIT Screen 04/03/2019 03/31/2018 03/12/2017  What Year? 0 points 0 points 0 points  What month? 0 points 0 points 0 points  What time? 0 points 0 points 0 points  Count back  from 20 0 points 0 points 0 points  Months in reverse 0 points 0 points 0 points  Repeat phrase 0 points 0 points 0 points  Total Score 0 0 0        Clinical Support from 04/03/2019 in [facility]  AUDIT-C Score  0       Home Environment:   Lives in one story home  No scattered rugs Grab bars in bathroom Adequate lighting/No clutter  No trouble climbing stairs   Patient Active Problem List   Diagnosis Date Noted  . Acute pharyngitis 05/21/2017  . COPD (chronic obstructive pulmonary disease) ([place]) 03/23/2017  . HTN (hypertension) 06/30/2013     Past Medical History:  Diagnosis Date  . Cancer ([place])   . COPD (chronic obstructive pulmonary disease) ([place])   . Hypertension      Past Surgical History:  Procedure Laterality Date  . BREAST SURGERY    . CESAREAN SECTION    . MASTECTOMY Left   . NECK SURGERY    . TUBAL LIGATION       Family History  Problem Relation Age of Onset  . Kidney failure Father   . Diabetes Father   . Heart disease Father   . Stroke Father   . Melanoma Sister   . Cancer Brother   . Mental illness Brother   . Breast cancer Neg Hx      Social History   Socioeconomic History  . Marital status: Widowed    Spouse name: Not on file  . Number of children: Not on file  . Years of education: Not on file  . Highest education level: Not on file  Occupational History  . Occupation: Therapist, sports  Social Needs  . Financial resource strain: Not on file  . Food insecurity    Worry: Not on file    Inability: Not on file  . Transportation needs    Medical: Not on file    Non-medical: Not on file  Tobacco Use  . Smoking status: Former Smoker    Types: Cigarettes  . Smokeless tobacco: Never Used  Substance and Sexual Activity  . Alcohol use: No    Alcohol/week: 0.0 standard drinks    Comment: rare  . Drug use: No  . Sexual activity: Not on file  Lifestyle  . Physical activity    Days per week: Not on file    Minutes per session:  Not on file  . Stress: Not on file  Relationships  . Social Herbalist on phone: Not on file    Gets together: Not on file    Attends religious service: Not on file    Active member of club or organization: Not on file    Attends meetings of clubs or organizations: Not on file    Relationship status: Not on file  . Intimate partner violence    Fear of current or ex partner: Not on file    Emotionally abused: Not on file    Physically abused: Not on file    Forced sexual activity: Not on file  Other Topics Concern  . Not on file  Social History Narrative   Married   Education: College   Exercise: some     Allergies  Allergen Reactions  . Demerol [Meperidine] Nausea And Vomiting  . Doxycycline     Sick on the stomach  . Monocid [Cefonicid] Nausea And Vomiting     Prior to Admission medications   Medication Sig Start Date End Date Taking? Authorizing Provider  amLODipine (NORVASC) 2.5 MG tablet Take 1 tablet (2.5 mg total) by mouth daily. 12/19/18  Yes [name], MD  calcium-vitamin D (OSCAL-500) 500-400 MG-UNIT tablet Take 1 tablet by mouth daily.   Yes [provider]  fluticasone furoate-vilanterol (BREO ELLIPTA) 100-25 MCG/INH AEPB INHALE 1 PUFF INTO THE LUNGS DAILY 12/13/18  Yes [name], MD  Multiple Vitamins-Minerals (MULTIVITAMIN ADULT PO) Take by mouth.   Yes [provider]  acetaminophen (TYLENOL) 500 MG tablet Take 500 mg by mouth every 6 (six) hours as needed.    [provider]  albuterol (PROVENTIL HFA;VENTOLIN HFA) 108 (90 Base) MCG/ACT inhaler Inhale 1-2 puffs into the lungs every 4 (four) hours as needed for wheezing or shortness of breath. Patient not taking: Reported on 04/03/2019 03/31/18   [name], MD  ibuprofen (ADVIL,MOTRIN) 200 MG tablet Take 200 mg by mouth every 6 (six) hours as needed.    [provider]     Depression screen [facility] 2/9 04/03/2019 12/19/2018 12/19/2018 03/31/2018  11/12/2017  Decreased Interest 0 0 0 0 0  Down, Depressed, Hopeless 0 0 0 0 0  PHQ - 2 Score 0 0 0 0 0     Fall Risk  04/03/2019 12/19/2018 12/19/2018 03/31/2018 11/12/2017  Falls in the past year? 0 0 0 No No  Comment - - - - -  Number falls in past yr: 0 0 0 - -  Injury with Fall? 0 0 0 - -  Follow up Falls evaluation completed;Education provided;Falls prevention discussed - - - -      PHYSICAL EXAM: BP 118/66 Comment: taken  by patient  Ht 5\' 4"  (1.626 m)   Wt 157 lb (71.2 kg)   BMI 26.95 kg/m    Wt Readings from Last 3 Encounters:  04/03/19 157 lb (71.2 kg)  03/31/18 157 lb (71.2 kg)  11/12/17 156 lb 3.2 oz (70.9 kg)     Medicare annual wellness visit, subsequent     Physical Exam   Education/Counseling provided regarding diet and exercise, prevention of chronic diseases, smoking/tobacco cessation, if applicable, and reviewed "Covered Medicare Preventive Services."

## 2019-04-03 NOTE — Patient Instructions (Addendum)
Thank you for taking time to come for your Medicare Wellness Visit. I appreciate your ongoing commitment to your health goals. Please review the following plan we discussed and let me know if I can assist you in the future.  Julie Greer LPN  Preventive Care 77 Years and Older, Female Preventive care refers to lifestyle choices and visits with your health care provider that can promote health and wellness. This includes:  A yearly physical exam. This is also called an annual well check.  Regular dental and eye exams.  Immunizations.  Screening for certain conditions.  Healthy lifestyle choices, such as diet and exercise. What can I expect for my preventive care visit? Physical exam Your health care provider will check:  Height and weight. These may be used to calculate body mass index (BMI), which is a measurement that tells if you are at a healthy weight.  Heart rate and blood pressure.  Your skin for abnormal spots. Counseling Your health care provider may ask you questions about:  Alcohol, tobacco, and drug use.  Emotional well-being.  Home and relationship well-being.  Sexual activity.  Eating habits.  History of falls.  Memory and ability to understand (cognition).  Work and work environment.  Pregnancy and menstrual history. What immunizations do I need?  Influenza (flu) vaccine  This is recommended every year. Tetanus, diphtheria, and pertussis (Tdap) vaccine  You may need a Td booster every 10 years. Varicella (chickenpox) vaccine  You may need this vaccine if you have not already been vaccinated. Zoster (shingles) vaccine  You may need this after age 60. Pneumococcal conjugate (PCV13) vaccine  One dose is recommended after age 77. Pneumococcal polysaccharide (PPSV23) vaccine  One dose is recommended after age 77. Measles, mumps, and rubella (MMR) vaccine  You may need at least one dose of MMR if you were born in 1957 or later. You may also  need a second dose. Meningococcal conjugate (MenACWY) vaccine  You may need this if you have certain conditions. Hepatitis A vaccine  You may need this if you have certain conditions or if you travel or work in places where you may be exposed to hepatitis A. Hepatitis B vaccine  You may need this if you have certain conditions or if you travel or work in places where you may be exposed to hepatitis B. Haemophilus influenzae type b (Hib) vaccine  You may need this if you have certain conditions. You may receive vaccines as individual doses or as more than one vaccine together in one shot (combination vaccines). Talk with your health care provider about the risks and benefits of combination vaccines. What tests do I need? Blood tests  Lipid and cholesterol levels. These may be checked every 5 years, or more frequently depending on your overall health.  Hepatitis C test.  Hepatitis B test. Screening  Lung cancer screening. You may have this screening every year starting at age 55 if you have a 30-pack-year history of smoking and currently smoke or have quit within the past 15 years.  Colorectal cancer screening. All adults should have this screening starting at age 50 and continuing until age 75. Your health care provider may recommend screening at age 45 if you are at increased risk. You will have tests every 1-10 years, depending on your results and the type of screening test.  Diabetes screening. This is done by checking your blood sugar (glucose) after you have not eaten for a while (fasting). You may have this done every 1-3   years.  Mammogram. This may be done every 1-2 years. Talk with your health care provider about how often you should have regular mammograms.  BRCA-related cancer screening. This may be done if you have a family history of breast, ovarian, tubal, or peritoneal cancers. Other tests  Sexually transmitted disease (STD) testing.  Bone density scan. This is done  to screen for osteoporosis. You may have this done starting at age 77. Follow these instructions at home: Eating and drinking  Eat a diet that includes fresh fruits and vegetables, whole grains, lean protein, and low-fat dairy products. Limit your intake of foods with high amounts of sugar, saturated fats, and salt.  Take vitamin and mineral supplements as recommended by your health care provider.  Do not drink alcohol if your health care provider tells you not to drink.  If you drink alcohol: ? Limit how much you have to 0-1 drink a day. ? Be aware of how much alcohol is in your drink. In the [place] one drink equals one 12 oz bottle of beer (355 mL), one 5 oz glass of wine (148 mL), or one 1 oz glass of hard liquor (44 mL). Lifestyle  Take daily care of your teeth and gums.  Stay active. Exercise for at least 30 minutes on 5 or more days each week.  Do not use any products that contain nicotine or tobacco, such as cigarettes, e-cigarettes, and chewing tobacco. If you need help quitting, ask your health care provider.  If you are sexually active, practice safe sex. Use a condom or other form of protection in order to prevent STIs (sexually transmitted infections).  Talk with your health care provider about taking a low-dose aspirin or statin. What's next?  Go to your health care provider once a year for a well check visit.  Ask your health care provider how often you should have your eyes and teeth checked.  Stay up to date on all vaccines. This information is not intended to replace advice given to you by your health care provider. Make sure you discuss any questions you have with your health care provider. Document Released: 07/19/2015 Document Revised: 06/16/2018 Document Reviewed: 06/16/2018 Elsevier Patient Education  2020 [employer].

## 2019-04-05 ENCOUNTER — Other Ambulatory Visit: Payer: Self-pay | Admitting: Family Medicine

## 2019-04-05 DIAGNOSIS — Z1231 Encounter for screening mammogram for malignant neoplasm of breast: Secondary | ICD-10-CM

## 2019-04-05 DIAGNOSIS — Z23 Encounter for immunization: Secondary | ICD-10-CM | POA: Diagnosis not present

## 2019-05-02 ENCOUNTER — Ambulatory Visit
Admission: RE | Admit: 2019-05-02 | Discharge: 2019-05-02 | Disposition: A | Discharge status: Home / Self Care | Payer: Medicare Other | Source: Ambulatory Visit | Attending: Family Medicine | Admitting: Family Medicine

## 2019-05-02 ENCOUNTER — Other Ambulatory Visit: Payer: Self-pay

## 2019-05-02 DIAGNOSIS — Z1231 Encounter for screening mammogram for malignant neoplasm of breast: Secondary | ICD-10-CM

## 2019-05-03 ENCOUNTER — Encounter: Payer: Self-pay | Admitting: Family Medicine

## 2019-06-16 ENCOUNTER — Telehealth: Payer: Self-pay | Admitting: Family Medicine

## 2019-06-16 NOTE — Telephone Encounter (Signed)
Dr. Carlota Raspberry have you seen any paperwork for mastectomy bras for this patient?  There is nothing in either one of your boxes.  Please advise.

## 2019-06-16 NOTE — Telephone Encounter (Signed)
Pt states the company [employer] has been faxing an order for her to get mastectomy bras since November with no response. Pt wants to know are you getting?  Please call.  Wray Kearns at [facility] phone  [phone]

## 2019-06-16 NOTE — Telephone Encounter (Signed)
No - will forward to [facility]. If not seen, then have refaxed to me and I will be happy to sign.

## 2019-06-20 NOTE — Telephone Encounter (Signed)
Maria Olsen called and is faxing over another request for mastectomy Bras ..please fill out and submitt/ pt has an appt her tomorrow FR

## 2019-06-21 ENCOUNTER — Other Ambulatory Visit: Payer: Self-pay

## 2019-06-21 ENCOUNTER — Telehealth: Payer: Medicare Other | Admitting: Family Medicine

## 2019-06-21 ENCOUNTER — Telehealth (INDEPENDENT_AMBULATORY_CARE_PROVIDER_SITE_OTHER): Payer: Medicare Other | Admitting: Family Medicine

## 2019-06-21 VITALS — BP 132/70 | Temp 98.1°F

## 2019-06-21 DIAGNOSIS — I10 Essential (primary) hypertension: Secondary | ICD-10-CM | POA: Diagnosis not present

## 2019-06-21 DIAGNOSIS — J449 Chronic obstructive pulmonary disease, unspecified: Secondary | ICD-10-CM | POA: Diagnosis not present

## 2019-06-21 DIAGNOSIS — R252 Cramp and spasm: Secondary | ICD-10-CM

## 2019-06-21 DIAGNOSIS — R7989 Other specified abnormal findings of blood chemistry: Secondary | ICD-10-CM | POA: Diagnosis not present

## 2019-06-21 DIAGNOSIS — R208 Other disturbances of skin sensation: Secondary | ICD-10-CM | POA: Diagnosis not present

## 2019-06-21 NOTE — Progress Notes (Signed)
Virtual Visit via Telephone Note  I connected with Maria Olsen on 06/21/19 at 5:03 PM by telephone and verified that I am speaking with the correct person using two identifiers.   I discussed the limitations, risks, security and privacy concerns of performing an evaluation and management service by telephone and the availability of in person appointments. I also discussed with the patient that there may be a patient responsible charge related to this service. The patient expressed understanding and agreed to proceed, consent obtained  Chief complaint: Kidney function, cough, cramps, dyspnea.   History of Present Illness: Maria Olsen is a 78 y.o. female  COPD: Currently on Breo Ellipta 100/25 mcg, albuterol as needed.  Some clear mucus production discussed in June, stable symptoms, continue on same regimen at that time.  Option of pulmonary evaluation discussed but declined. Feels like mucus production increased some past few months, still clear. More dyspnea at times past few months - notes with most activities - just has to slow down. Dyspnea with bags of groceries. No chest pains, no fever. . Albuterol inhaler - rare use - 1-2 times per month. Min change. Usually just rests to catch breath.   Numbness, tingling of feet with leg cramps  past 4 to 5 months. More leg cramps with walking, notes more at night. Tingling on bottom of feet.   No LE swelling. No color change of toes. No known history of PVD. No pain with walking - just later.    Hypertension with elevated creatinine: Discussed via telemedicine visit in June, amlodipine 2.5 mg daily at that time.  No NSAIDs.  eGFR 45 in September 2019, 47 on repeat testing in June with creatinine 1.13.  Blood pressures were controlled between 102 to 162/60-83 but usually in the 110s over 60-70 at that visit.  No new side effects with meds.  Home readings: usually 120/70's.  Constitutional: Negative for unexpected weight change.  Eyes:  Negative for visual disturbance.  Respiratory: Negative for cough, chest tightnessCardiovascular: Negative for chest pain, palpitations and leg swelling.  Gastrointestinal: Negative for abdominal pain and blood in stool.  Neurological: Negative for dizziness, light-headedness and headaches.     BP Readings from Last 3 Encounters:  06/21/19 132/70  04/03/19 118/66  03/31/18 (!) 150/80   Lab Results  Component Value Date   CREATININE 1.13 (H) 12/26/2018          Patient Active Problem List   Diagnosis Date Noted  . Acute pharyngitis 05/21/2017  . COPD (chronic obstructive pulmonary disease) ([place]) 03/23/2017  . HTN (hypertension) 06/30/2013   Past Medical History:  Diagnosis Date  . Cancer ([place])   . COPD (chronic obstructive pulmonary disease) ([place])   . Hypertension    Past Surgical History:  Procedure Laterality Date  . BREAST SURGERY    . CESAREAN SECTION    . MASTECTOMY Left   . NECK SURGERY    . TUBAL LIGATION     Allergies  Allergen Reactions  . Demerol [Meperidine] Nausea And Vomiting  . Doxycycline     Sick on the stomach  . Monocid [Cefonicid] Nausea And Vomiting   Prior to Admission medications   Medication Sig Start Date End Date Taking? Authorizing Provider  acetaminophen (TYLENOL) 500 MG tablet Take 500 mg by mouth every 6 (six) hours as needed.   Yes [provider]  albuterol (PROVENTIL HFA;VENTOLIN HFA) 108 (90 Base) MCG/ACT inhaler Inhale 1-2 puffs into the lungs every 4 (four) hours as needed for  wheezing or shortness of breath. 03/31/18  Yes [name], MD  amLODipine (NORVASC) 2.5 MG tablet Take 1 tablet (2.5 mg total) by mouth daily. 12/19/18  Yes [name], MD  calcium-vitamin D (OSCAL-500) 500-400 MG-UNIT tablet Take 1 tablet by mouth daily.   Yes [provider]  fluticasone furoate-vilanterol (BREO ELLIPTA) 100-25 MCG/INH AEPB INHALE 1 PUFF INTO THE LUNGS DAILY 12/13/18  Yes [name], MD  ibuprofen  (ADVIL,MOTRIN) 200 MG tablet Take 200 mg by mouth every 6 (six) hours as needed.   Yes [provider]  Multiple Vitamins-Minerals (MULTIVITAMIN ADULT PO) Take by mouth.   Yes [provider]   Social History   Socioeconomic History  . Marital status: Widowed    Spouse name: Not on file  . Number of children: Not on file  . Years of education: Not on file  . Highest education level: Not on file  Occupational History  . Occupation: Therapist, sports  Tobacco Use  . Smoking status: Former Smoker    Types: Cigarettes  . Smokeless tobacco: Never Used  Substance and Sexual Activity  . Alcohol use: No    Alcohol/week: 0.0 standard drinks    Comment: rare  . Drug use: No  . Sexual activity: Not on file  Other Topics Concern  . Not on file  Social History Narrative   Married   Education: Secretary/administrator   Exercise: some   Social Determinants of Radio broadcast assistant Strain:   . Difficulty of Paying Living Expenses: Not on file  Food Insecurity:   . Worried About Charity fundraiser in the Last Year: Not on file  . Ran Out of Food in the Last Year: Not on file  Transportation Needs:   . Lack of Transportation (Medical): Not on file  . Lack of Transportation (Non-Medical): Not on file  Physical Activity:   . Days of Exercise per Week: Not on file  . Minutes of Exercise per Session: Not on file  Stress:   . Feeling of Stress : Not on file  Social Connections:   . Frequency of Communication with Friends and Family: Not on file  . Frequency of Social Gatherings with Friends and Family: Not on file  . Attends Religious Services: Not on file  . Active Member of Clubs or Organizations: Not on file  . Attends Archivist Meetings: Not on file  . Marital Status: Not on file  Intimate Partner Violence:   . Fear of Current or Ex-Partner: Not on file  . Emotionally Abused: Not on file  . Physically Abused: Not on file  . Sexually Abused: Not on file      Observations/Objective: Vitals:   06/21/19 1050  BP: 132/70  Temp: 98.1 F (36.7 C)  TempSrc: Oral     Assessment and Plan: Elevated serum creatinine - Plan: Basic metabolic panel  - with hx htn, repeat creatinine. Fluids, avoidance of nephrotoxins.   Chronic obstructive pulmonary disease, unspecified COPD type ([place]) - Plan: Ambulatory referral to Pulmonology  - refer to pulmonary as some increased symptoms.  Continued on Breo, albuterol if needed for now with rtc/er precautions.   Dysesthesia - Plan: CBC Essential hypertension - Plan: Basic metabolic panel, CBC, TSH Muscle cramps - Plan: Basic metabolic panel, CBC, TSH  - lab only visit to eval lytes. Possible neuropathy - plan on in office visit after labs.   Follow Up Instructions: Patient Instructions  I will refer you to pulmonary, okay  to use albuterol if needed for increased wheezing or shortness of breath for now.  Continue Breo at  same dose for now.Return to the clinic or go to the nearest emergency room if any of your symptoms worsen or new symptoms occur.  Please return for lab only visit in the next week or 2 if possible then an office visit with me in a few weeks to evaluate the foot/leg symptoms further..   Please let me know if there are questions in the meantime.           I discussed the assessment and treatment plan with the patient. The patient was provided an opportunity to ask questions and all were answered. The patient agreed with the plan and demonstrated an understanding of the instructions.   The patient was advised to call back or seek an in-person evaluation if the symptoms worsen or if the condition fails to improve as anticipated.  I provided 19 minutes of non-face-to-face time during this encounter.  Signed,   Merri Ray, MD [facility].  06/21/19

## 2019-06-21 NOTE — Patient Instructions (Signed)
I will refer you to pulmonary, okay to use albuterol if needed for increased wheezing or shortness of breath for now.  Continue Breo at  same dose for now.Return to the clinic or go to the nearest emergency room if any of your symptoms worsen or new symptoms occur.  Please return for lab only visit in the next week or 2 if possible then an office visit with me in a few weeks to evaluate the foot/leg symptoms further..   Please let me know if there are questions in the meantime.

## 2019-06-21 NOTE — Telephone Encounter (Signed)
Paper work was sign and faxed by Solmon Ice in front office

## 2019-06-21 NOTE — Progress Notes (Signed)
CC- 6 month follow up Kidney function-  Lot more mucus production, tingling and numbness in feet and leg cramps worsen in the last 4-5 month. easily get sob now hx of COPD Need form filled out for mastectomy bra- formes have been sign and faxed

## 2019-06-28 ENCOUNTER — Ambulatory Visit (INDEPENDENT_AMBULATORY_CARE_PROVIDER_SITE_OTHER): Payer: Medicare Other | Admitting: Family Medicine

## 2019-06-28 ENCOUNTER — Other Ambulatory Visit: Payer: Self-pay

## 2019-06-28 DIAGNOSIS — R252 Cramp and spasm: Secondary | ICD-10-CM

## 2019-06-28 DIAGNOSIS — R208 Other disturbances of skin sensation: Secondary | ICD-10-CM

## 2019-06-28 DIAGNOSIS — R7989 Other specified abnormal findings of blood chemistry: Secondary | ICD-10-CM

## 2019-06-28 DIAGNOSIS — I10 Essential (primary) hypertension: Secondary | ICD-10-CM

## 2019-06-29 LAB — BASIC METABOLIC PANEL
BUN/Creatinine Ratio: 23 (ref 12–28)
BUN: 24 mg/dL (ref 8–27)
CO2: 25 mmol/L (ref 20–29)
Calcium: 9.3 mg/dL (ref 8.7–10.3)
Chloride: 100 mmol/L (ref 96–106)
Creatinine, Ser: 1.04 mg/dL — ABNORMAL HIGH (ref 0.57–1.00)
GFR calc Af Amer: 60 mL/min/{1.73_m2} (ref >59)
GFR calc non Af Amer: 52 mL/min/{1.73_m2} — ABNORMAL LOW (ref >59)
Glucose: 90 mg/dL (ref 65–99)
Potassium: 4.8 mmol/L (ref 3.5–5.2)
Sodium: 140 mmol/L (ref 134–144)

## 2019-06-29 LAB — CBC
Hematocrit: 41.8 % (ref 34.0–46.6)
Hemoglobin: 13.5 g/dL (ref 11.1–15.9)
MCH: 29.3 pg (ref 26.6–33.0)
MCHC: 32.3 g/dL (ref 31.5–35.7)
MCV: 91 fL (ref 79–97)
Platelets: 320 10*3/uL (ref 150–450)
RBC: 4.61 x10E6/uL (ref 3.77–5.28)
RDW: 14 % (ref 11.7–15.4)
WBC: 6.7 10*3/uL (ref 3.4–10.8)

## 2019-06-29 LAB — TSH: TSH: 1.94 u[IU]/mL (ref 0.450–4.500)

## 2019-07-19 ENCOUNTER — Ambulatory Visit (INDEPENDENT_AMBULATORY_CARE_PROVIDER_SITE_OTHER): Payer: Medicare Other | Admitting: Family Medicine

## 2019-07-19 ENCOUNTER — Other Ambulatory Visit: Payer: Self-pay

## 2019-07-19 ENCOUNTER — Encounter: Payer: Self-pay | Admitting: Family Medicine

## 2019-07-19 DIAGNOSIS — I10 Essential (primary) hypertension: Secondary | ICD-10-CM | POA: Diagnosis not present

## 2019-07-19 MED ORDER — AMLODIPINE BESYLATE 5 MG PO TABS: 5.0000 mg | ORAL_TABLET | Freq: Every day | ORAL | 1 refills | Status: DC

## 2019-07-19 NOTE — Progress Notes (Signed)
Subjective:  Patient ID: Maria Olsen, female    DOB: Mar 09, 1941  Age: 79 y.o. MRN: [id number]  CC:  Chief Complaint  Patient presents with  . Follow-up    on the numbness and tingiling in pt's legs. pt is doing much better. pt states she thinks she was just dehydrated. pt started drinking alot more water after telehealht last month and symptoms subsided.    HPI Maria Olsen presents for   Hypertension: With elevated creatinine.  Telemedicine visit December 16.  Home readings 120s over 70s.  Was continued on amlodipine 2.5 mg daily.  No NSAIDs.  Was having some leg cramps at times with walking and tingling in her feet, those have since improved with increased fluids. Rare tingling in feet - cramping has resolved.   Home readings:no recent home reading.  Missed doses - 1-2/month. Last missed dose - not recent.  Breathing is better than last visit as well. No new HA., chest pains, no new weakness.   BP Readings from Last 3 Encounters:  07/19/19 (!) 182/90  06/21/19 132/70  04/03/19 118/66   Lab Results  Component Value Date   CREATININE 1.04 (H) 06/28/2019   Had yag laser treatment on 12/13 and 12/28 for membrane that formed after cataract surgery. Has been on prednisone eye drops past month. xiidra for dry eye as well - had reaction - itching/redness/drainage - followed by optho. Increase steroid eye drops to 4 per day (prior 2 per day)    History Patient Active Problem List   Diagnosis Date Noted  . Acute pharyngitis 05/21/2017  . COPD (chronic obstructive pulmonary disease) ([place]) 03/23/2017  . HTN (hypertension) 06/30/2013   Past Medical History:  Diagnosis Date  . Cancer ([place])   . COPD (chronic obstructive pulmonary disease) ([place])   . Hypertension    Past Surgical History:  Procedure Laterality Date  . BREAST SURGERY    . CESAREAN SECTION    . MASTECTOMY Left   . NECK SURGERY    . TUBAL LIGATION     Allergies  Allergen Reactions  . Xiidra [Lifitegrast]     . Demerol [Meperidine] Nausea And Vomiting  . Doxycycline     Sick on the stomach  . Monocid [Cefonicid] Nausea And Vomiting   Prior to Admission medications   Medication Sig Start Date End Date Taking? Authorizing Provider  acetaminophen (TYLENOL) 500 MG tablet Take 500 mg by mouth every 6 (six) hours as needed.   Yes [provider]  albuterol (PROVENTIL HFA;VENTOLIN HFA) 108 (90 Base) MCG/ACT inhaler Inhale 1-2 puffs into the lungs every 4 (four) hours as needed for wheezing or shortness of breath. 03/31/18  Yes [name], MD  amLODipine (NORVASC) 2.5 MG tablet Take 1 tablet (2.5 mg total) by mouth daily. 12/19/18  Yes [name], MD  calcium-vitamin D (OSCAL-500) 500-400 MG-UNIT tablet Take 1 tablet by mouth daily.   Yes [provider]  fluticasone furoate-vilanterol (BREO ELLIPTA) 100-25 MCG/INH AEPB INHALE 1 PUFF INTO THE LUNGS DAILY 12/13/18  Yes [name], MD  ibuprofen (ADVIL,MOTRIN) 200 MG tablet Take 200 mg by mouth every 6 (six) hours as needed.    [provider]  Multiple Vitamins-Minerals (MULTIVITAMIN ADULT PO) Take by mouth.    [provider]   Social History   Socioeconomic History  . Marital status: Widowed    Spouse name: Not on file  . Number of children: Not on file  . Years of education: Not  on file  . Highest education level: Not on file  Occupational History  . Occupation: Therapist, sports  Tobacco Use  . Smoking status: Former Smoker    Types: Cigarettes  . Smokeless tobacco: Never Used  Substance and Sexual Activity  . Alcohol use: No    Alcohol/week: 0.0 standard drinks    Comment: rare  . Drug use: No  . Sexual activity: Not on file  Other Topics Concern  . Not on file  Social History Narrative   Married   Education: Secretary/administrator   Exercise: some   Social Determinants of Radio broadcast assistant Strain:   . Difficulty of Paying Living Expenses: Not on file  Food Insecurity:   . Worried About Paediatric nurse in the Last Year: Not on file  . Ran Out of Food in the Last Year: Not on file  Transportation Needs:   . Lack of Transportation (Medical): Not on file  . Lack of Transportation (Non-Medical): Not on file  Physical Activity:   . Days of Exercise per Week: Not on file  . Minutes of Exercise per Session: Not on file  Stress:   . Feeling of Stress : Not on file  Social Connections:   . Frequency of Communication with Friends and Family: Not on file  . Frequency of Social Gatherings with Friends and Family: Not on file  . Attends Religious Services: Not on file  . Active Member of Clubs or Organizations: Not on file  . Attends Archivist Meetings: Not on file  . Marital Status: Not on file  Intimate Partner Violence:   . Fear of Current or Ex-Partner: Not on file  . Emotionally Abused: Not on file  . Physically Abused: Not on file  . Sexually Abused: Not on file    Review of Systems  Constitutional: Negative for fatigue and unexpected weight change.  Respiratory: Negative for chest tightness and shortness of breath.   Cardiovascular: Negative for chest pain, palpitations and leg swelling.  Gastrointestinal: Negative for abdominal pain and blood in stool.  Neurological: Negative for dizziness, syncope, light-headedness and headaches.     Objective:   Vitals:   07/19/19 1622 07/19/19 1628  BP: (!) 189/84 (!) 182/90  Pulse: 86   Temp: 98.9 F (37.2 C)   TempSrc: Temporal   SpO2: 95%   Weight: 157 lb 9.6 oz (71.5 kg)   Height: 5\' 4"  (1.626 m)      Physical Exam Vitals reviewed.  Constitutional:      Appearance: She is well-developed.  HENT:     Head: Normocephalic and atraumatic.  Eyes:     Conjunctiva/sclera: Conjunctivae normal.     Pupils: Pupils are equal, round, and reactive to light.  Neck:     Vascular: No carotid bruit.  Cardiovascular:     Rate and Rhythm: Normal rate and regular rhythm.     Heart sounds: Normal heart sounds.    Pulmonary:     Effort: Pulmonary effort is normal.     Breath sounds: Normal breath sounds.  Abdominal:     Palpations: Abdomen is soft. There is no pulsatile mass.     Tenderness: There is no abdominal tenderness.  Skin:    General: Skin is warm and dry.  Neurological:     Mental Status: She is alert and oriented to person, place, and time.  Psychiatric:        Behavior: Behavior normal.      Assessment & Plan:  Maria Olsen is a 79 y.o. female . Essential hypertension - Plan: amLODipine (NORVASC) 5 MG tablet  -Decreased control.  Potentially could be related to new ophthalmic medication.  Will increase Norvasc to 5 mg daily.  Monitor home readings.  Repeat telemedicine..  Asymptomatic with current hypertension.  ER/RTC precautions given  Meds ordered this encounter  Medications  . amLODipine (NORVASC) 5 MG tablet    Sig: Take 1 tablet (5 mg total) by mouth daily.    Dispense:  90 tablet    Refill:  1   Patient Instructions    Increase amlodipine to 5mg  per day. Marland KitchenKeep a record of your blood pressures outside of the office for follow up call next week.  Return to the clinic or go to the nearest emergency room if any of your symptoms worsen or new symptoms occur.  COVID-19 Vaccine Information can be found at: ShippingScam.co.uk For questions related to vaccine distribution or appointments, please email [email or web address] or call [phone].    If you have lab work done today you will be contacted with your lab results within the next 2 weeks.  If you have not heard from [place] then please contact us. The fastest way to get your results is to register for My Chart.   IF you received an x-ray today, you will receive an invoice from [facility]. Please contact [facility] at [phone] with questions or concerns regarding your invoice.   IF you received labwork today, you will receive an invoice  from [facility]. Please contact [facility] at [phone] with questions or concerns regarding your invoice.   Our billing staff will not be able to assist you with questions regarding bills from these companies.  You will be contacted with the lab results as soon as they are available. The fastest way to get your results is to activate your My Chart account. Instructions are located on the last page of this paperwork. If you have not heard from [place] regarding the results in 2 weeks, please contact this office.         Signed, Merri Ray, MD [facility]

## 2019-07-19 NOTE — Patient Instructions (Addendum)
  Increase amlodipine to 5mg  per day. Marland KitchenKeep a record of your blood pressures outside of the office for follow up call next week.  Return to the clinic or go to the nearest emergency room if any of your symptoms worsen or new symptoms occur.  COVID-19 Vaccine Information can be found at: ShippingScam.co.uk For questions related to vaccine distribution or appointments, please email vaccine@[email or web address] or call [phone].    If you have lab work done today you will be contacted with your lab results within the next 2 weeks.  If you have not heard from [place] then please contact us. The fastest way to get your results is to register for My Chart.   IF you received an x-ray today, you will receive an invoice from [facility]. Please contact [facility] at [phone] with questions or concerns regarding your invoice.   IF you received labwork today, you will receive an invoice from [facility]. Please contact [facility] at [phone] with questions or concerns regarding your invoice.   Our billing staff will not be able to assist you with questions regarding bills from these companies.  You will be contacted with the lab results as soon as they are available. The fastest way to get your results is to activate your My Chart account. Instructions are located on the last page of this paperwork. If you have not heard from [place] regarding the results in 2 weeks, please contact this office.

## 2019-07-26 ENCOUNTER — Other Ambulatory Visit: Payer: Self-pay

## 2019-07-26 ENCOUNTER — Encounter: Payer: Self-pay | Admitting: Family Medicine

## 2019-07-26 ENCOUNTER — Telehealth (INDEPENDENT_AMBULATORY_CARE_PROVIDER_SITE_OTHER): Payer: Medicare Other | Admitting: Family Medicine

## 2019-07-26 VITALS — Ht 64.0 in | Wt 157.0 lb

## 2019-07-26 DIAGNOSIS — I10 Essential (primary) hypertension: Secondary | ICD-10-CM

## 2019-07-26 NOTE — Progress Notes (Signed)
Virtual Visit via Video Note  I connected with [name] on 07/26/19 at 3:57 PM by a video enabled telemedicine application [employer]ximity and verified that I am speaking with the correct person using two identifiers.   I discussed the limitations, risks, security and privacy concerns of performing an evaluation and management service by telephone and the availability of in person appointments. I also discussed with the patient that there may be a patient responsible charge related to this service. The patient expressed understanding and agreed to proceed, consent obtained  Chief complaint:  HTN  Chief Complaint  Patient presents with  . Follow-up    Hypertention. pt put a recored of her BP results since her last vist in mychart. as a question for the doctor. pt has no current physical symptoms.     History of Present Illness: Maria Olsen is a 79 y.o. female  Hypertension:  See last visit 1 week ago. Increased amlodpine to 5mg , no missed doses.  Home readings per mychart message: 1/14 - 6:30 am     123/65              12 Noon   161/28 1/15 - 6:30 am    131/78              1 pm          149/67 1/16 -  7 am          103/60               11 am        120/63 1/17     9am           142/68               7:40 pm    136/73 1/18     8:30 am     141/64               8 pm           152/54 1/19     1:30pm      144/68  Started on Medro 4 mg dosepak for eye problem which had gotten worse and  Keflex 500 mg 1 tab bid x 3 days                9:30 pm     167/83 1/20      11:30 am   164/79  Higher BP on medrol dose pack as above.    Constitutional: Negative for fatigue and unexpected weight change.  Eyes: visual disturbance - treated by optho - on medrol dosepak.  Respiratory: Negative for cough, chest tightness and shortness of breath.   Cardiovascular: Negative for chest pain, palpitations and leg swelling.  Gastrointestinal: Negative for abdominal pain and blood in stool.   Neurological: Negative for dizziness, light-headedness and headaches. no focal weakness.   Feels ok.  BP today 166/78.   BP Readings from Last 3 Encounters:  07/19/19 (!) 182/90  06/21/19 132/70  04/03/19 118/66   Lab Results  Component Value Date   CREATININE 1.04 (H) 06/28/2019     Patient Active Problem List   Diagnosis Date Noted  . Acute pharyngitis 05/21/2017  . COPD (chronic obstructive pulmonary disease) ([place]) 03/23/2017  . HTN (hypertension) 06/30/2013   Past Medical History:  Diagnosis Date  . Cancer ([place])   . COPD (chronic obstructive pulmonary disease) ([place])   . Hypertension    Past Surgical History:  Procedure Laterality Date  . BREAST SURGERY    . CESAREAN SECTION    . MASTECTOMY Left   . NECK SURGERY    . TUBAL LIGATION     Allergies  Allergen Reactions  . Xiidra [Lifitegrast]   . Demerol [Meperidine] Nausea And Vomiting  . Doxycycline     Sick on the stomach  . Monocid [Cefonicid] Nausea And Vomiting   Prior to Admission medications   Medication Sig Start Date End Date Taking? Authorizing Provider  acetaminophen (TYLENOL) 500 MG tablet Take 500 mg by mouth every 6 (six) hours as needed.   Yes [provider]  albuterol (PROVENTIL HFA;VENTOLIN HFA) 108 (90 Base) MCG/ACT inhaler Inhale 1-2 puffs into the lungs every 4 (four) hours as needed for wheezing or shortness of breath. 03/31/18  Yes [name], MD  amLODipine (NORVASC) 5 MG tablet Take 1 tablet (5 mg total) by mouth daily. 07/19/19  Yes [name], MD  calcium-vitamin D (OSCAL-500) 500-400 MG-UNIT tablet Take 1 tablet by mouth daily.   Yes [provider]  fluticasone furoate-vilanterol (BREO ELLIPTA) 100-25 MCG/INH AEPB INHALE 1 PUFF INTO THE LUNGS DAILY 12/13/18  Yes [name], MD  ibuprofen (ADVIL,MOTRIN) 200 MG tablet Take 200 mg by mouth every 6 (six) hours as needed.   Yes [provider]  Multiple Vitamins-Minerals (MULTIVITAMIN ADULT PO)  Take by mouth.   Yes [provider]   Social History   Socioeconomic History  . Marital status: Widowed    Spouse name: Not on file  . Number of children: Not on file  . Years of education: Not on file  . Highest education level: Not on file  Occupational History  . Occupation: Therapist, sports  Tobacco Use  . Smoking status: Former Smoker    Types: Cigarettes  . Smokeless tobacco: Never Used  Substance and Sexual Activity  . Alcohol use: No    Alcohol/week: 0.0 standard drinks    Comment: rare  . Drug use: No  . Sexual activity: Not on file  Other Topics Concern  . Not on file  Social History Narrative   Married   Education: Secretary/administrator   Exercise: some   Social Determinants of Radio broadcast assistant Strain:   . Difficulty of Paying Living Expenses: Not on file  Food Insecurity:   . Worried About Charity fundraiser in the Last Year: Not on file  . Ran Out of Food in the Last Year: Not on file  Transportation Needs:   . Lack of Transportation (Medical): Not on file  . Lack of Transportation (Non-Medical): Not on file  Physical Activity:   . Days of Exercise per Week: Not on file  . Minutes of Exercise per Session: Not on file  Stress:   . Feeling of Stress : Not on file  Social Connections:   . Frequency of Communication with Friends and Family: Not on file  . Frequency of Social Gatherings with Friends and Family: Not on file  . Attends Religious Services: Not on file  . Active Member of Clubs or Organizations: Not on file  . Attends Archivist Meetings: Not on file  . Marital Status: Not on file  Intimate Partner Violence:   . Fear of Current or Ex-Partner: Not on file  . Emotionally Abused: Not on file  . Physically Abused: Not on file  . Sexually Abused: Not on file    Observations/Objective: Vitals:   07/26/19 1401  Weight: 157  lb (71.2 kg)  Height: 5\' 4"  (1.626 m)  166/78   Assessment and Plan: Essential hypertension  -Improving on  higher dose of amlodipine until start of Medrol Dosepak.  We will add additional 2.5 mg for now with use of Medrol Dosepak, follow blood pressure readings twice per day, then as systolics closer to [id number], return to just 5 mg daily.  Orthostatic, hypotensive precautions given.  RTC precautions given.  1 month follow-up virtual.    Follow Up Instructions:  1 month - virtual.  I discussed the assessment and treatment plan with the patient. The patient was provided an opportunity to ask questions and all were answered. The patient agreed with the plan and demonstrated an understanding of the instructions.   The patient was advised to call back or seek an in-person evaluation if the symptoms worsen or if the condition fails to improve as anticipated.  I provided 12 minutes of non-face-to-face time during this encounter.   [name], MD

## 2019-07-26 NOTE — Patient Instructions (Addendum)
   Add the 2.5mg  amlodipine to 5mg  for now as medrol is likely worsening blood pressure. As blood pressures get down to 120's, then return to 5mg  alone. Follow up in 1 month - virtual visit.   Return to the clinic or go to the nearest emergency room if any of your symptoms worsen or new symptoms occur.   If you have lab work done today you will be contacted with your lab results within the next 2 weeks.  If you have not heard from [place] then please contact us. The fastest way to get your results is to register for My Chart.   IF you received an x-ray today, you will receive an invoice from [facility]. Please contact [facility] at [phone] with questions or concerns regarding your invoice.   IF you received labwork today, you will receive an invoice from [facility]. Please contact [facility] at [phone] with questions or concerns regarding your invoice.   Our billing staff will not be able to assist you with questions regarding bills from these companies.  You will be contacted with the lab results as soon as they are available. The fastest way to get your results is to activate your My Chart account. Instructions are located on the last page of this paperwork. If you have not heard from [place] regarding the results in 2 weeks, please contact this office.

## 2019-07-27 ENCOUNTER — Encounter: Payer: Self-pay | Admitting: Family Medicine

## 2019-08-02 ENCOUNTER — Other Ambulatory Visit: Payer: Self-pay

## 2019-08-02 DIAGNOSIS — R239 Unspecified skin changes: Secondary | ICD-10-CM

## 2019-08-04 DIAGNOSIS — L821 Other seborrheic keratosis: Secondary | ICD-10-CM | POA: Diagnosis not present

## 2019-08-04 DIAGNOSIS — D1801 Hemangioma of skin and subcutaneous tissue: Secondary | ICD-10-CM | POA: Diagnosis not present

## 2019-08-04 DIAGNOSIS — D225 Melanocytic nevi of trunk: Secondary | ICD-10-CM | POA: Diagnosis not present

## 2019-08-04 DIAGNOSIS — L814 Other melanin hyperpigmentation: Secondary | ICD-10-CM | POA: Diagnosis not present

## 2019-08-13 ENCOUNTER — Ambulatory Visit: Payer: Medicare Other | Attending: Internal Medicine

## 2019-08-13 DIAGNOSIS — Z23 Encounter for immunization: Secondary | ICD-10-CM | POA: Insufficient documentation

## 2019-08-13 NOTE — Progress Notes (Signed)
   Covid-19 Vaccination Clinic  Name:  Maria Olsen    MRN: [id number] DOB: 1941-05-31  08/13/2019  [name] was observed post Covid-19 immunization for 15 minutes without incidence. She was provided with Vaccine Information Sheet and instruction to access the V-Safe system.   [name] was instructed to call 911 with any severe reactions post vaccine: Marland Kitchen Difficulty breathing  . Swelling of your face and throat  . A fast heartbeat  . A bad rash all over your body  . Dizziness and weakness    Immunizations Administered    Name Date Dose VIS Date Route   Pfizer COVID-19 Vaccine 08/13/2019  3:57 PM 0.3 mL 06/16/2019 Intramuscular   Manufacturer: [place]   Lot: [id number]   [place]: [id number]

## 2019-08-25 ENCOUNTER — Telehealth (INDEPENDENT_AMBULATORY_CARE_PROVIDER_SITE_OTHER): Payer: Medicare Other | Admitting: Family Medicine

## 2019-08-25 ENCOUNTER — Other Ambulatory Visit: Payer: Self-pay

## 2019-08-25 ENCOUNTER — Telehealth: Payer: Self-pay | Admitting: Family Medicine

## 2019-08-25 ENCOUNTER — Encounter: Payer: Self-pay | Admitting: Family Medicine

## 2019-08-25 VITALS — BP 168/83 | HR 73 | Ht 64.0 in | Wt 153.0 lb

## 2019-08-25 DIAGNOSIS — I10 Essential (primary) hypertension: Secondary | ICD-10-CM | POA: Diagnosis not present

## 2019-08-25 NOTE — Patient Instructions (Signed)
Thank you for taking my call today.  I am glad to see the numbers are overall better.  Continue amlodipine 5 mg once per day for now, unless you start getting more lower readings, then can cut back to 2.5 mg.  Follow-up in 4 months, but please let me know if there are questions in the meantime.

## 2019-08-25 NOTE — Telephone Encounter (Signed)
Patient wanted to give her Blood pressure readings

## 2019-08-25 NOTE — Progress Notes (Signed)
Virtual Visit via Video Note  I connected with Maria Olsen on 08/25/19 at 1:16 PM by a video enabled telemedicine application and verified that I am speaking with the correct person using two identifiers.   I discussed the limitations, risks, security and privacy concerns of performing an evaluation and management service by telephone and the availability of in person appointments. I also discussed with the patient that there may be a patient responsible charge related to this service. The patient expressed understanding and agreed to proceed, consent obtained  Chief complaint: Chief Complaint  Patient presents with  . Follow-up    on hypertension. pt state her BP has been inconsisitant.saying in the 120/80 range with spike of it getting into the 150's and 90's. pt hasn't had any physical symptoms of hypertension. pt reports no side effects with her medication.      History of Present Illness: Maria Olsen is a 79 y.o. female   Hypertension: With history of elevated creatinine.  Telemedicine visit in December, readings stable at that time with amlodipine 2.5 mg daily, but increasing readings after that visit.  Seen January 13, amlodipine increased to 5 mg daily.  Thought that some of her elevations may have been due in part to use of new ophthalmic medication, then treated with prednisone, Medrol Dosepak.  Additional 2.5 mg for total dose of 7.5 mg planned while on Medrol Dosepak, then return to 5 mg when off that med.  Now on 5mg  amlodipine past few weeks. Tolerating well, no new side efffects.   Home readings: Sent by Mychart this morning: 2/6- 124/58 2/7- 118/68        -134/71 2/8-124/69 2/9-118/65        -120/76 2/10-132/75          -134/70 2/11- 142/77 2/12- 108/63          -131/75 2/13- 126/69 2/14- 137/65 2/15- 141/70 2/16- 132/66           - 147/84 2/17- 135/71          - 127/63 2/18- 108/53          - 128/69 2/19- 168/83  Constitutional: Negative for  fatigue and unexpected weight change.  Eyes: Negative for visual disturbance.  Respiratory: Negative for cough, chest tightness and shortness of breath.   Cardiovascular: Negative for chest pain, palpitations and leg swelling.  Gastrointestinal: Negative for abdominal pain and blood in stool.  Neurological: Negative for dizziness, light-headedness and headaches.     BP Readings from Last 3 Encounters:  08/25/19 (!) 168/83  07/19/19 (!) 182/90  06/21/19 132/70   Lab Results  Component Value Date   CREATININE 1.04 (H) 06/28/2019      Patient Active Problem List   Diagnosis Date Noted  . Acute pharyngitis 05/21/2017  . COPD (chronic obstructive pulmonary disease) ([place]) 03/23/2017  . HTN (hypertension) 06/30/2013   Past Medical History:  Diagnosis Date  . Cancer ([place])   . COPD (chronic obstructive pulmonary disease) ([place])   . Hypertension    Past Surgical History:  Procedure Laterality Date  . BREAST SURGERY    . CESAREAN SECTION    . MASTECTOMY Left   . NECK SURGERY    . TUBAL LIGATION     Allergies  Allergen Reactions  . Xiidra [Lifitegrast]   . Demerol [Meperidine] Nausea And Vomiting  . Doxycycline     Sick on the stomach  . Monocid [Cefonicid] Nausea And Vomiting   Prior to Admission medications  Medication Sig Start Date End Date Taking? Authorizing Provider  acetaminophen (TYLENOL) 500 MG tablet Take 500 mg by mouth every 6 (six) hours as needed.   Yes [provider]  albuterol (PROVENTIL HFA;VENTOLIN HFA) 108 (90 Base) MCG/ACT inhaler Inhale 1-2 puffs into the lungs every 4 (four) hours as needed for wheezing or shortness of breath. 03/31/18  Yes [name], MD  amLODipine (NORVASC) 5 MG tablet Take 1 tablet (5 mg total) by mouth daily. 07/19/19  Yes [name], MD  calcium-vitamin D (OSCAL-500) 500-400 MG-UNIT tablet Take 1 tablet by mouth daily.   Yes [provider]  fluticasone furoate-vilanterol (BREO ELLIPTA) 100-25  MCG/INH AEPB INHALE 1 PUFF INTO THE LUNGS DAILY 12/13/18  Yes [name], MD  ibuprofen (ADVIL,MOTRIN) 200 MG tablet Take 200 mg by mouth every 6 (six) hours as needed.   Yes [provider]  Multiple Vitamins-Minerals (MULTIVITAMIN ADULT PO) Take by mouth.   Yes [provider]   Social History   Socioeconomic History  . Marital status: Widowed    Spouse name: Not on file  . Number of children: Not on file  . Years of education: Not on file  . Highest education level: Not on file  Occupational History  . Occupation: Therapist, sports  Tobacco Use  . Smoking status: Former Smoker    Types: Cigarettes  . Smokeless tobacco: Never Used  Substance and Sexual Activity  . Alcohol use: No    Alcohol/week: 0.0 standard drinks    Comment: rare  . Drug use: No  . Sexual activity: Not on file  Other Topics Concern  . Not on file  Social History Narrative   Married   Education: Secretary/administrator   Exercise: some   Social Determinants of Radio broadcast assistant Strain:   . Difficulty of Paying Living Expenses: Not on file  Food Insecurity:   . Worried About Charity fundraiser in the Last Year: Not on file  . Ran Out of Food in the Last Year: Not on file  Transportation Needs:   . Lack of Transportation (Medical): Not on file  . Lack of Transportation (Non-Medical): Not on file  Physical Activity:   . Days of Exercise per Week: Not on file  . Minutes of Exercise per Session: Not on file  Stress:   . Feeling of Stress : Not on file  Social Connections:   . Frequency of Communication with Friends and Family: Not on file  . Frequency of Social Gatherings with Friends and Family: Not on file  . Attends Religious Services: Not on file  . Active Member of Clubs or Organizations: Not on file  . Attends Archivist Meetings: Not on file  . Marital Status: Not on file  Intimate Partner Violence:   . Fear of Current or Ex-Partner: Not on file  . Emotionally Abused: Not on  file  . Physically Abused: Not on file  . Sexually Abused: Not on file    Observations/Objective: Vitals:   08/25/19 1129  BP: (!) 168/83  Pulse: 73  Weight: 153 lb (69.4 kg)  Height: 5\' 4"  (1.626 m)  reading above when cooking earlier. Repeat testing on call:  BP 143/77, P 76.  Nontoxic-appearing on video, no distress, appropriate responses.  No respiratory distress.  All questions answered with understanding expressed.  Assessment and Plan: Essential hypertension  -Improved readings as above on 5 mg dosing.  Asymptomatic when borderline low.  Option of half dosing  at 2.5 mg if persistent systolics in the [id number] or 123XX123 or if she is having any orthostatic symptoms.  Otherwise continue 5 mg daily for now, recheck 3 to 4 months.  Follow Up Instructions: 4 months.     I discussed the assessment and treatment plan with the patient. The patient was provided an opportunity to ask questions and all were answered. The patient agreed with the plan and demonstrated an understanding of the instructions.   The patient was advised to call back or seek an in-person evaluation if the symptoms worsen or if the condition fails to improve as anticipated.  I provided 9 minutes of non-face-to-face time during this encounter.   [name], MD

## 2019-08-25 NOTE — Progress Notes (Signed)
Called pt 2x. [email or web address]

## 2019-08-29 ENCOUNTER — Ambulatory Visit: Payer: PRIVATE HEALTH INSURANCE

## 2019-09-07 ENCOUNTER — Ambulatory Visit: Payer: Medicare Other | Attending: Internal Medicine

## 2019-09-07 DIAGNOSIS — Z23 Encounter for immunization: Secondary | ICD-10-CM | POA: Insufficient documentation

## 2019-09-07 NOTE — Progress Notes (Signed)
   Covid-19 Vaccination Clinic  Name:  Maria Olsen    MRN: [id number] DOB: Jan 14, 1941  09/07/2019  [name] was observed post Covid-19 immunization for 15 minutes without incident. She was provided with Vaccine Information Sheet and instruction to access the V-Safe system.   [name] was instructed to call 911 with any severe reactions post vaccine: Marland Kitchen Difficulty breathing  . Swelling of face and throat  . A fast heartbeat  . A bad rash all over body  . Dizziness and weakness   Immunizations Administered    Name Date Dose VIS Date Route   Pfizer COVID-19 Vaccine 09/07/2019 10:53 AM 0.3 mL 06/16/2019 Intramuscular   Manufacturer: [place]   Lot: [id number]   [place]: [id number]

## 2019-11-22 ENCOUNTER — Ambulatory Visit (INDEPENDENT_AMBULATORY_CARE_PROVIDER_SITE_OTHER): Payer: Medicare Other | Admitting: Family Medicine

## 2019-11-22 ENCOUNTER — Encounter: Payer: Self-pay | Admitting: Family Medicine

## 2019-11-22 ENCOUNTER — Other Ambulatory Visit: Payer: Self-pay

## 2019-11-22 VITALS — BP 144/75 | HR 76 | Temp 97.6°F | Resp 15 | Ht 64.0 in | Wt 154.0 lb

## 2019-11-22 DIAGNOSIS — R7989 Other specified abnormal findings of blood chemistry: Secondary | ICD-10-CM

## 2019-11-22 DIAGNOSIS — I10 Essential (primary) hypertension: Secondary | ICD-10-CM | POA: Diagnosis not present

## 2019-11-22 NOTE — Patient Instructions (Addendum)
  No med changes.  Swelling may be due to amlodipine. Let me know if that worsens. Good luck with the wedding in August!   If you have lab work done today you will be contacted with your lab results within the next 2 weeks.  If you have not heard from [place] then please contact us. The fastest way to get your results is to register for My Chart.   IF you received an x-ray today, you will receive an invoice from [facility]. Please contact [facility] Radiology at [phone] with questions or concerns regarding your invoice.   IF you received labwork today, you will receive an invoice from [place]. Please contact [facility] at [phone] with questions or concerns regarding your invoice.   Our billing staff will not be able to assist you with questions regarding bills from these companies.  You will be contacted with the lab results as soon as they are available. The fastest way to get your results is to activate your My Chart account. Instructions are located on the last page of this paperwork. If you have not heard from [place] regarding the results in 2 weeks, please contact this office.

## 2019-11-22 NOTE — Progress Notes (Signed)
Subjective:  Patient ID: Maria Olsen, female    DOB: 08-13-1940  Age: 79 y.o. MRN: [id number]  CC:  Chief Complaint  Patient presents with  . Hypertension    pt has been taking her BP almost daily, noted some slight swelling in her legs otherwise no side effects     HPI CERENITY HARDWELL presents for   Hypertension: norvasc 5mg  qd.  Home readings: 120/70 usually. Rare elevation.  Min leg swelling at times, lines in socks only. No wounds/redness.  BP Readings from Last 3 Encounters:  11/22/19 (!) 144/75  08/25/19 (!) 168/83  07/19/19 (!) 182/90   Lab Results  Component Value Date   CREATININE 1.04 (H) 06/28/2019   Daughter's wedding in August.  History Patient Active Problem List   Diagnosis Date Noted  . Acute pharyngitis 05/21/2017  . COPD (chronic obstructive pulmonary disease) ([place]) 03/23/2017  . HTN (hypertension) 06/30/2013   Past Medical History:  Diagnosis Date  . Cancer ([place])   . COPD (chronic obstructive pulmonary disease) ([place])   . Hypertension    Past Surgical History:  Procedure Laterality Date  . BREAST SURGERY    . CESAREAN SECTION    . MASTECTOMY Left   . NECK SURGERY    . TUBAL LIGATION     Allergies  Allergen Reactions  . Xiidra [Lifitegrast]   . Demerol [Meperidine] Nausea And Vomiting  . Doxycycline     Sick on the stomach  . Monocid [Cefonicid] Nausea And Vomiting   Prior to Admission medications   Medication Sig Start Date End Date Taking? Authorizing Provider  acetaminophen (TYLENOL) 500 MG tablet Take 500 mg by mouth every 6 (six) hours as needed.    [provider]  albuterol (PROVENTIL HFA;VENTOLIN HFA) 108 (90 Base) MCG/ACT inhaler Inhale 1-2 puffs into the lungs every 4 (four) hours as needed for wheezing or shortness of breath. 03/31/18   [name], MD  amLODipine (NORVASC) 5 MG tablet Take 1 tablet (5 mg total) by mouth daily. 07/19/19   [name], MD  calcium-vitamin D (OSCAL-500) 500-400 MG-UNIT tablet  Take 1 tablet by mouth daily.    [provider]  fluticasone furoate-vilanterol (BREO ELLIPTA) 100-25 MCG/INH AEPB INHALE 1 PUFF INTO THE LUNGS DAILY 12/13/18   [name], MD  ibuprofen (ADVIL,MOTRIN) 200 MG tablet Take 200 mg by mouth every 6 (six) hours as needed.    [provider]  Multiple Vitamins-Minerals (MULTIVITAMIN ADULT PO) Take by mouth.    [provider]   Social History   Socioeconomic History  . Marital status: Widowed    Spouse name: Not on file  . Number of children: Not on file  . Years of education: Not on file  . Highest education level: Not on file  Occupational History  . Occupation: Therapist, sports  Tobacco Use  . Smoking status: Former Smoker    Types: Cigarettes  . Smokeless tobacco: Never Used  Substance and Sexual Activity  . Alcohol use: No    Alcohol/week: 0.0 standard drinks    Comment: rare  . Drug use: No  . Sexual activity: Not on file  Other Topics Concern  . Not on file  Social History Narrative   Married   Education: Secretary/administrator   Exercise: some   Social Determinants of Radio broadcast assistant Strain:   . Difficulty of Paying Living Expenses:   Food Insecurity:   . Worried About Charity fundraiser in the Last  Year:   . Ran Out of Food in the Last Year:   Transportation Needs:   . Film/video editor (Medical):   Marland Kitchen Lack of Transportation (Non-Medical):   Physical Activity:   . Days of Exercise per Week:   . Minutes of Exercise per Session:   Stress:   . Feeling of Stress :   Social Connections:   . Frequency of Communication with Friends and Family:   . Frequency of Social Gatherings with Friends and Family:   . Attends Religious Services:   . Active Member of Clubs or Organizations:   . Attends Archivist Meetings:   Marland Kitchen Marital Status:   Intimate Partner Violence:   . Fear of Current or Ex-Partner:   . Emotionally Abused:   Marland Kitchen Physically Abused:   . Sexually Abused:     Review of  Systems  Constitutional: Negative for fatigue and unexpected weight change.  Respiratory: Negative for chest tightness and shortness of breath.   Cardiovascular: Positive for leg swelling (minimal. ). Negative for chest pain and palpitations.  Gastrointestinal: Negative for abdominal pain and blood in stool.  Neurological: Negative for dizziness, syncope, light-headedness and headaches.     Objective:   Vitals:   11/22/19 1047  BP: (!) 144/75  Pulse: 76  Resp: 15  Temp: 97.6 F (36.4 C)  TempSrc: Temporal  SpO2: 94%  Weight: 154 lb (69.9 kg)  Height: 5\' 4"  (1.626 m)     Physical Exam Vitals reviewed.  Constitutional:      Appearance: She is well-developed.  HENT:     Head: Normocephalic and atraumatic.  Eyes:     Conjunctiva/sclera: Conjunctivae normal.     Pupils: Pupils are equal, round, and reactive to light.  Neck:     Vascular: No carotid bruit.  Cardiovascular:     Rate and Rhythm: Normal rate and regular rhythm.     Heart sounds: Normal heart sounds.  Pulmonary:     Effort: Pulmonary effort is normal.     Breath sounds: Normal breath sounds.  Abdominal:     Palpations: Abdomen is soft. There is no pulsatile mass.     Tenderness: There is no abdominal tenderness.  Musculoskeletal:     Right lower leg: Edema (trace at ankles only, no skin changes. ) present.     Left lower leg: Edema present.  Skin:    General: Skin is warm and dry.  Neurological:     Mental Status: She is alert and oriented to person, place, and time.  Psychiatric:        Behavior: Behavior normal.     Assessment & Plan:  CECELY MINNIS is a 79 y.o. female . Essential hypertension - Plan: Basic metabolic panel  Elevated serum creatinine - Plan: Basic metabolic panel  -Blood pressure stable on recheck, tolerating current regimen.  Minimal pedal edema may be related to amlodipine, but does not wish to change regimen at this time.  RTC precautions, recheck 6 months.  Recheck BMP with  borderline creatinine previously.  Possible component of age-related nephrosclerosis.  No orders of the defined types were placed in this encounter.  Patient Instructions    No med changes.  Swelling may be due to amlodipine. Let me know if that worsens. Good luck with the wedding in August!   If you have lab work done today you will be contacted with your lab results within the next 2 weeks.  If you have not heard from [place] then please  contact us. The fastest way to get your results is to register for My Chart.   IF you received an x-ray today, you will receive an invoice from [facility]. Please contact [facility] at [phone] with questions or concerns regarding your invoice.   IF you received labwork today, you will receive an invoice from [facility]. Please contact [facility] at [phone] with questions or concerns regarding your invoice.   Our billing staff will not be able to assist you with questions regarding bills from these companies.  You will be contacted with the lab results as soon as they are available. The fastest way to get your results is to activate your My Chart account. Instructions are located on the last page of this paperwork. If you have not heard from [place] regarding the results in 2 weeks, please contact this office.         Signed, Merri Ray, MD [facility]

## 2019-11-23 LAB — BASIC METABOLIC PANEL
BUN/Creatinine Ratio: 17 (ref 12–28)
BUN: 20 mg/dL (ref 8–27)
CO2: 23 mmol/L (ref 20–29)
Calcium: 9.7 mg/dL (ref 8.7–10.3)
Chloride: 103 mmol/L (ref 96–106)
Creatinine, Ser: 1.16 mg/dL — ABNORMAL HIGH (ref 0.57–1.00)
GFR calc Af Amer: 52 mL/min/{1.73_m2} — ABNORMAL LOW (ref >59)
GFR calc non Af Amer: 46 mL/min/{1.73_m2} — ABNORMAL LOW (ref >59)
Glucose: 87 mg/dL (ref 65–99)
Potassium: 4.5 mmol/L (ref 3.5–5.2)
Sodium: 143 mmol/L (ref 134–144)

## 2019-12-03 ENCOUNTER — Encounter: Payer: Self-pay | Admitting: Family Medicine

## 2019-12-05 NOTE — Telephone Encounter (Signed)
Pt asking if theres concerns about her GFR or creatinine? Please advise

## 2020-01-01 ENCOUNTER — Other Ambulatory Visit: Payer: Self-pay | Admitting: Family Medicine

## 2020-01-01 DIAGNOSIS — J449 Chronic obstructive pulmonary disease, unspecified: Secondary | ICD-10-CM

## 2020-01-10 ENCOUNTER — Other Ambulatory Visit: Payer: Self-pay | Admitting: Family Medicine

## 2020-01-10 DIAGNOSIS — I10 Essential (primary) hypertension: Secondary | ICD-10-CM

## 2020-01-10 NOTE — Telephone Encounter (Signed)
Requested Prescriptions  Pending Prescriptions Disp Refills  . amLODipine (NORVASC) 5 MG tablet [Pharmacy Med Name: AMLODIPINE BESYLATE 5 MG TAB] 90 tablet 1    Sig: TAKE 1 TABLET BY MOUTH EVERY DAY     Cardiovascular:  Calcium Channel Blockers Failed - 01/10/2020  1:39 AM      Failed - Last BP in normal range    BP Readings from Last 1 Encounters:  11/22/19 (!) 144/75         Passed - Valid encounter within last 6 months    Recent Outpatient Visits          1 month ago Essential hypertension   [facility] [place], MD   4 months ago Essential hypertension   [facility] [place], MD   5 months ago Essential hypertension   [facility], Ranell Patrick, MD   5 months ago Essential hypertension   [facility], Ranell Patrick, MD   6 months ago Essential hypertension   [facility], Arlie Solomons, MD      Future Appointments            In 4 months Carlota Raspberry Ranell Patrick, MD [facility] [place], [facility]

## 2020-04-10 ENCOUNTER — Encounter: Payer: Self-pay | Admitting: Family Medicine

## 2020-04-10 ENCOUNTER — Other Ambulatory Visit: Payer: Self-pay | Admitting: Family Medicine

## 2020-04-10 DIAGNOSIS — Z1231 Encounter for screening mammogram for malignant neoplasm of breast: Secondary | ICD-10-CM

## 2020-04-26 DIAGNOSIS — Z23 Encounter for immunization: Secondary | ICD-10-CM | POA: Diagnosis not present

## 2020-05-07 ENCOUNTER — Ambulatory Visit
Admission: RE | Admit: 2020-05-07 | Discharge: 2020-05-07 | Disposition: A | Discharge status: Home / Self Care | Payer: Medicare Other | Source: Ambulatory Visit | Attending: Family Medicine | Admitting: Family Medicine

## 2020-05-07 ENCOUNTER — Other Ambulatory Visit: Payer: Self-pay

## 2020-05-07 DIAGNOSIS — Z1231 Encounter for screening mammogram for malignant neoplasm of breast: Secondary | ICD-10-CM

## 2020-05-07 HISTORY — DX: Malignant neoplasm of unspecified site of unspecified female breast: C50.919

## 2020-05-21 ENCOUNTER — Ambulatory Visit (INDEPENDENT_AMBULATORY_CARE_PROVIDER_SITE_OTHER): Payer: Medicare Other | Admitting: Registered Nurse

## 2020-05-21 ENCOUNTER — Other Ambulatory Visit: Payer: Self-pay

## 2020-05-21 DIAGNOSIS — Z0000 Encounter for general adult medical examination without abnormal findings: Secondary | ICD-10-CM | POA: Diagnosis not present

## 2020-05-21 LAB — BASIC METABOLIC PANEL
BUN/Creatinine Ratio: 25 (ref 12–28)
BUN: 23 mg/dL (ref 8–27)
CO2: 26 mmol/L (ref 20–29)
Calcium: 9.8 mg/dL (ref 8.7–10.3)
Chloride: 102 mmol/L (ref 96–106)
Creatinine, Ser: 0.91 mg/dL (ref 0.57–1.00)
GFR calc Af Amer: 70 mL/min/{1.73_m2} (ref >59)
GFR calc non Af Amer: 61 mL/min/{1.73_m2} (ref >59)
Glucose: 91 mg/dL (ref 65–99)
Potassium: 5.1 mmol/L (ref 3.5–5.2)
Sodium: 140 mmol/L (ref 134–144)

## 2020-05-21 NOTE — Addendum Note (Signed)
Addended by: Meredeth Ide on: 05/21/2020 09:16 AM   Modules accepted: Orders

## 2020-05-23 ENCOUNTER — Ambulatory Visit (INDEPENDENT_AMBULATORY_CARE_PROVIDER_SITE_OTHER): Payer: Medicare Other | Admitting: Family Medicine

## 2020-05-23 ENCOUNTER — Encounter: Payer: Self-pay | Admitting: Family Medicine

## 2020-05-23 ENCOUNTER — Other Ambulatory Visit: Payer: Self-pay

## 2020-05-23 VITALS — BP 136/82 | HR 77 | Temp 98.0°F | Ht 64.0 in | Wt 154.0 lb

## 2020-05-23 DIAGNOSIS — Z0000 Encounter for general adult medical examination without abnormal findings: Secondary | ICD-10-CM

## 2020-05-23 DIAGNOSIS — J449 Chronic obstructive pulmonary disease, unspecified: Secondary | ICD-10-CM

## 2020-05-23 DIAGNOSIS — Z0001 Encounter for general adult medical examination with abnormal findings: Secondary | ICD-10-CM | POA: Diagnosis not present

## 2020-05-23 DIAGNOSIS — I10 Essential (primary) hypertension: Secondary | ICD-10-CM | POA: Diagnosis not present

## 2020-05-23 MED ORDER — AMLODIPINE BESYLATE 5 MG PO TABS: 5.0000 mg | ORAL_TABLET | Freq: Every day | ORAL | 2 refills | Status: DC

## 2020-05-23 MED ORDER — BREO ELLIPTA 100-25 MCG/INH IN AEPB: INHALATION_SPRAY | RESPIRATORY_TRACT | 6 refills | Status: DC

## 2020-05-23 NOTE — Patient Instructions (Addendum)
Bring your blood pressure machine to next visit to make sure readings are accurate. No change in meds for now.    Health Maintenance After Age 78 After age 35, you are at a higher risk for certain long-term diseases and infections as well as injuries from falls. Falls are a major cause of broken bones and head injuries in people who are older than age 29. Getting regular preventive care can help to keep you healthy and well. Preventive care includes getting regular testing and making lifestyle changes as recommended by your health care provider. Talk with your health care provider about:  Which screenings and tests you should have. A screening is a test that checks for a disease when you have no symptoms.  A diet and exercise plan that is right for you. What should I know about screenings and tests to prevent falls? Screening and testing are the best ways to find a health problem early. Early diagnosis and treatment give you the best chance of managing medical conditions that are common after age 67. Certain conditions and lifestyle choices may make you more likely to have a fall. Your health care provider may recommend:  Regular vision checks. Poor vision and conditions such as cataracts can make you more likely to have a fall. If you wear glasses, make sure to get your prescription updated if your vision changes.  Medicine review. Work with your health care provider to regularly review all of the medicines you are taking, including over-the-counter medicines. Ask your health care provider about any side effects that may make you more likely to have a fall. Tell your health care provider if any medicines that you take make you feel dizzy or sleepy.  Osteoporosis screening. Osteoporosis is a condition that causes the bones to get weaker. This can make the bones weak and cause them to break more easily.  Blood pressure screening. Blood pressure changes and medicines to control blood pressure can  make you feel dizzy.  Strength and balance checks. Your health care provider may recommend certain tests to check your strength and balance while standing, walking, or changing positions.  Foot health exam. Foot pain and numbness, as well as not wearing proper footwear, can make you more likely to have a fall.  Depression screening. You may be more likely to have a fall if you have a fear of falling, feel emotionally low, or feel unable to do activities that you used to do.  Alcohol use screening. Using too much alcohol can affect your balance and may make you more likely to have a fall. What actions can I take to lower my risk of falls? General instructions  Talk with your health care provider about your risks for falling. Tell your health care provider if: ? You fall. Be sure to tell your health care provider about all falls, even ones that seem minor. ? You feel dizzy, sleepy, or off-balance.  Take over-the-counter and prescription medicines only as told by your health care provider. These include any supplements.  Eat a healthy diet and maintain a healthy weight. A healthy diet includes low-fat dairy products, low-fat (lean) meats, and fiber from whole grains, beans, and lots of fruits and vegetables. Home safety  Remove any tripping hazards, such as rugs, cords, and clutter.  Install safety equipment such as grab bars in bathrooms and safety rails on stairs.  Keep rooms and walkways well-lit. Activity   Follow a regular exercise program to stay fit. This will help you  maintain your balance. Ask your health care provider what types of exercise are appropriate for you.  If you need a cane or walker, use it as recommended by your health care provider.  Wear supportive shoes that have nonskid soles. Lifestyle  Do not drink alcohol if your health care provider tells you not to drink.  If you drink alcohol, limit how much you have: ? 0-1 drink a day for women. ? 0-2 drinks a  day for men.  Be aware of how much alcohol is in your drink. In the [place] one drink equals one typical bottle of beer (12 oz), one-half glass of wine (5 oz), or one shot of hard liquor (1 oz).  Do not use any products that contain nicotine or tobacco, such as cigarettes and e-cigarettes. If you need help quitting, ask your health care provider. Summary  Having a healthy lifestyle and getting preventive care can help to protect your health and wellness after age 46.  Screening and testing are the best way to find a health problem early and help you avoid having a fall. Early diagnosis and treatment give you the best chance for managing medical conditions that are more common for people who are older than age 54.  Falls are a major cause of broken bones and head injuries in people who are older than age 87. Take precautions to prevent a fall at home.  Work with your health care provider to learn what changes you can make to improve your health and wellness and to prevent falls. This information is not intended to replace advice given to you by your health care provider. Make sure you discuss any questions you have with your health care provider. Document Revised: 10/13/2018 Document Reviewed: 05/05/2017 Elsevier Patient Education  [employer].      If you have lab work done today you will be contacted with your lab results within the next 2 weeks.  If you have not heard from [place] then please contact us. The fastest way to get your results is to register for My Chart.   IF you received an x-ray today, you will receive an invoice from [facility]. Please contact [facility] at [phone] with questions or concerns regarding your invoice.   IF you received labwork today, you will receive an invoice from [facility]. Please contact [facility] at [phone] with questions or concerns regarding your invoice.   Our billing staff will not be able to assist you with questions  regarding bills from these companies.  You will be contacted with the lab results as soon as they are available. The fastest way to get your results is to activate your My Chart account. Instructions are located on the last page of this paperwork. If you have not heard from [place] regarding the results in 2 weeks, please contact this office.

## 2020-05-23 NOTE — Progress Notes (Signed)
Subjective:  Patient ID: Maria Olsen, female    DOB: [birth year]  Age: 79 y.o. MRN: [id number]  CC:  Chief Complaint  Patient presents with  . Medication Refill    pt reports she feels good with no complaints.    HPI Maria Olsen presents for  Annual wellness exam. Care team: PCP: me.  Derm: Jamse [place].  Optometry: Julio Sicks - Dr. Natonya Fetter for cataract surgery.   Hypertension: Norvasc 5 mg daily.  Last evaluated in May, stable blood pressure on recheck at that time.  Minimal pedal edema at that visit but remains on amlodipine. No missed doses.  Home readings:120/70 range.  High in office typically.  BP Readings from Last 3 Encounters:  05/23/20 136/82  11/22/19 (!) 144/75  08/25/19 (!) 168/83   Lab Results  Component Value Date   CREATININE 0.91 05/21/2020    COPD Treated with Breo Ellipta 1 puff QAm, albuterol- none needed in past year. Have discussed pulmonary eval in past - declines eval at this time - stable. Shortness of breath with exertion only - no recent changes.   Fall Risk  05/23/2020 11/22/2019 07/26/2019 07/19/2019 04/03/2019  Falls in the past year? 0 0 - 0 0  Comment - - - - -  Number falls in past yr: - - 0 0 0  Injury with Fall? - - 0 0 0  Follow up Falls evaluation completed Falls evaluation completed - - Falls evaluation completed;Education provided;Falls prevention discussed  no loose rugs Has adequate lighting.  Stairs into home. Has handrail. Grab bars in bathroom - has hold.    Depression screen [facility] 2/9 05/23/2020 11/22/2019 07/26/2019 07/19/2019 04/03/2019  Decreased Interest 0 0 0 0 0  Down, Depressed, Hopeless 0 0 0 0 0  PHQ - 2 Score 0 0 0 0 0   Mammogram 05/09/2020  Immunization History  Administered Date(s) Administered  . Fluad Quad(high Dose 65+) 04/05/2019  . Influenza Split 06/08/2012  . Influenza, High Dose Seasonal PF 03/26/2018  . Influenza,inj,Quad PF,6+ Mos 03/12/2017  . Influenza-Unspecified 04/13/2016, 03/26/2018,  04/26/2020  . PFIZER SARS-COV-2 Vaccination 08/13/2019, 09/07/2019  . Pneumococcal Conjugate-13 05/21/2014  . Pneumococcal Polysaccharide-23 10/16/2015  has not received covid booster. Plans to get soon.   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  Hearing: ok with hearing aids.    6CIT Screen 05/23/2020 04/03/2019 03/31/2018 03/12/2017  What Year? 0 points 0 points 0 points 0 points  What month? 0 points 0 points 0 points 0 points  What time? 0 points 0 points 0 points 0 points  Count back from 20 0 points 0 points 0 points 0 points  Months in reverse 0 points 0 points 0 points 0 points  Repeat phrase 0 points 0 points 0 points 0 points  Total Score 0 0 0 0    No exam data present Has optho following as above.   Dental: every 6 months.   Exercise: with housecleaning, gardening.   Advanced directives. Has HCPOA and living will.    History Patient Active Problem List  Diagnosis Date Noted  . Acute pharyngitis 05/21/2017  . COPD (chronic obstructive pulmonary disease) ([place]) 03/23/2017  . HTN (hypertension) 06/30/2013   Past Medical History:  Diagnosis Date  . Breast cancer ([place])   . Cancer ([place])   . COPD (chronic obstructive pulmonary disease) ([place])   . Hypertension    Past Surgical History:  Procedure Laterality Date  . BREAST SURGERY    . CESAREAN SECTION    . MASTECTOMY Left   . NECK SURGERY    . TUBAL LIGATION     Allergies  Allergen Reactions  . Xiidra [Lifitegrast]   .  Demerol [Meperidine] Nausea And Vomiting  . Doxycycline     Sick on the stomach  . Monocid [Cefonicid] Nausea And Vomiting   Prior to Admission medications   Medication Sig Start Date End Date Taking? Authorizing Provider  acetaminophen (TYLENOL) 500 MG tablet Take 500 mg by mouth every 6 (six) hours as needed.   Yes [provider]  albuterol (PROVENTIL HFA;VENTOLIN HFA) 108 (90 Base) MCG/ACT inhaler Inhale 1-2 puffs into the lungs every 4 (four) hours as needed for wheezing or shortness of breath. 03/31/18  Yes [name], MD  amLODipine (NORVASC) 5 MG tablet TAKE 1 TABLET BY MOUTH EVERY DAY 01/10/20  Yes [name], MD  BREO ELLIPTA 100-25 MCG/INH AEPB INHALE 1 PUFF EVERY DAY 01/01/20  Yes [name], MD  calcium-vitamin D (OSCAL-500) 500-400 MG-UNIT tablet Take 1 tablet by mouth daily.   Yes [provider]  ibuprofen (ADVIL,MOTRIN) 200 MG tablet Take 200 mg by mouth every 6 (six) hours as needed.   Yes [provider]  Multiple Vitamins-Minerals (MULTIVITAMIN ADULT PO) Take by mouth.   Yes [provider]   Social History   Socioeconomic History  . Marital status: Widowed    Spouse name: Not on file  . Number of children: Not on file  . Years of education: Not on file  . Highest education level: Not on file  Occupational History  . Occupation: Therapist, sports  Tobacco Use  . Smoking status: Former Smoker    Types: Cigarettes  . Smokeless tobacco: Never Used  Vaping Use  . Vaping Use: Never used  Substance and Sexual Activity  . Alcohol use: No    Alcohol/week: 0.0 standard drinks    Comment: rare  . Drug use: No  . Sexual activity: Not Currently  Other Topics Concern  . Not on file  Social History Narrative   Married   Education: Secretary/administrator   Exercise: some   Social Determinants of Radio broadcast assistant Strain:   . Difficulty of Paying Living Expenses: Not on file  Food Insecurity:   . Worried About Charity fundraiser  in the Last Year: Not on file  . Ran Out of Food in the Last Year: Not on file  Transportation Needs:   . Lack of Transportation (Medical): Not on file  . Lack of Transportation (Non-Medical): Not on file  Physical Activity:   . Days of Exercise per Week: Not on file  . Minutes of Exercise per Session: Not on file  Stress:   . Feeling of Stress : Not on file  Social Connections:   . Frequency of Communication with Friends and Family: Not on file  . Frequency of Social Gatherings with Friends and Family: Not on file  . Attends Religious Services: Not on file  . Active Member of Clubs or Organizations: Not on file  .  Attends Archivist Meetings: Not on file  . Marital Status: Not on file  Intimate Partner Violence:   . Fear of Current or Ex-Partner: Not on file  . Emotionally Abused: Not on file  . Physically Abused: Not on file  . Sexually Abused: Not on file    Review of Systems 13 point review of systems per patient health survey noted.  Negative other than as indicated above or in HPI.    Objective:   Vitals:   05/23/20 0808 05/23/20 0845  BP: (!) 169/72 136/82  Pulse: 77   Temp: 98 F (36.7 C)   TempSrc: Temporal   SpO2: 96%   Weight: 154 lb (69.9 kg)   Height: 5\' 4"  (1.626 m)      Physical Exam Vitals reviewed.  Constitutional:      Appearance: She is well-developed.  HENT:     Head: Normocephalic and atraumatic.  Eyes:     Conjunctiva/sclera: Conjunctivae normal.     Pupils: Pupils are equal, round, and reactive to light.  Neck:     Vascular: No carotid bruit.  Cardiovascular:     Rate and Rhythm: Normal rate and regular rhythm.     Heart sounds: Normal heart sounds.  Pulmonary:     Effort: Pulmonary effort is normal.     Breath sounds: Normal breath sounds.  Abdominal:     Palpations: Abdomen is soft. There is no pulsatile mass.     Tenderness: There is no abdominal tenderness.  Skin:    General: Skin is warm and dry.  Neurological:      Mental Status: She is alert and oriented to person, place, and time.  Psychiatric:        Behavior: Behavior normal.        Assessment & Plan:  Albert Devaul is a 79 y.o. female . Essential hypertension - Plan: amLODipine (NORVASC) 5 MG tablet  -Improved on recheck, continue same regimen.  Chronic obstructive pulmonary disease, unspecified COPD type ([place]) - Plan: fluticasone furoate-vilanterol (BREO ELLIPTA) 100-25 MCG/INH AEPB  -Stable, continue same regimen.  Medicare annual wellness exam - anticipatory guidance as below in AVS, screening labs if needed. Health maintenance items as above in HPI discussed/recommended as applicable.  - no concerning responses on depression, fall, or functional status screening. Any positive responses noted as above. Advanced directives discussed as in CH[facility].  -Plans on Covid booster soon.   Meds ordered this encounter  Medications  . amLODipine (NORVASC) 5 MG tablet    Sig: Take 1 tablet (5 mg total) by mouth daily.    Dispense:  90 tablet    Refill:  2  . fluticasone furoate-vilanterol (BREO ELLIPTA) 100-25 MCG/INH AEPB    Sig: INHALE 1 PUFF EVERY DAY    Dispense:  60 each    Refill:  6   Patient Instructions   Bring your blood pressure machine to next visit to make sure readings are accurate. No change in meds for now.    Health Maintenance After Age 7 After age 34, you are at a higher risk for certain long-term diseases and infections as well as injuries from falls. Falls are a major cause of broken bones and head injuries in people who are older than age 48. Getting regular preventive care can help to keep you healthy and well. Preventive care includes getting regular testing and making lifestyle changes as recommended by your health care provider. Talk with your health care provider about:  Which screenings and  tests you should have. A screening is a test that checks for a disease when you have no symptoms.  A diet and exercise plan  that is right for you. What should I know about screenings and tests to prevent falls? Screening and testing are the best ways to find a health problem early. Early diagnosis and treatment give you the best chance of managing medical conditions that are common after age 45. Certain conditions and lifestyle choices may make you more likely to have a fall. Your health care provider may recommend:  Regular vision checks. Poor vision and conditions such as cataracts can make you more likely to have a fall. If you wear glasses, make sure to get your prescription updated if your vision changes.  Medicine review. Work with your health care provider to regularly review all of the medicines you are taking, including over-the-counter medicines. Ask your health care provider about any side effects that may make you more likely to have a fall. Tell your health care provider if any medicines that you take make you feel dizzy or sleepy.  Osteoporosis screening. Osteoporosis is a condition that causes the bones to get weaker. This can make the bones weak and cause them to break more easily.  Blood pressure screening. Blood pressure changes and medicines to control blood pressure can make you feel dizzy.  Strength and balance checks. Your health care provider may recommend certain tests to check your strength and balance while standing, walking, or changing positions.  Foot health exam. Foot pain and numbness, as well as not wearing proper footwear, can make you more likely to have a fall.  Depression screening. You may be more likely to have a fall if you have a fear of falling, feel emotionally low, or feel unable to do activities that you used to do.  Alcohol use screening. Using too much alcohol can affect your balance and may make you more likely to have a fall. What actions can I take to lower my risk of falls? General instructions  Talk with your health care provider about your risks for falling. Tell  your health care provider if: ? You fall. Be sure to tell your health care provider about all falls, even ones that seem minor. ? You feel dizzy, sleepy, or off-balance.  Take over-the-counter and prescription medicines only as told by your health care provider. These include any supplements.  Eat a healthy diet and maintain a healthy weight. A healthy diet includes low-fat dairy products, low-fat (lean) meats, and fiber from whole grains, beans, and lots of fruits and vegetables. Home safety  Remove any tripping hazards, such as rugs, cords, and clutter.  Install safety equipment such as grab bars in bathrooms and safety rails on stairs.  Keep rooms and walkways well-lit. Activity   Follow a regular exercise program to stay fit. This will help you maintain your balance. Ask your health care provider what types of exercise are appropriate for you.  If you need a cane or walker, use it as recommended by your health care provider.  Wear supportive shoes that have nonskid soles. Lifestyle  Do not drink alcohol if your health care provider tells you not to drink.  If you drink alcohol, limit how much you have: ? 0-1 drink a day for women. ? 0-2 drinks a day for men.  Be aware of how much alcohol is in your drink. In the [place] one drink equals one typical bottle of beer (12 oz), one-half glass  of wine (5 oz), or one shot of hard liquor (1 oz).  Do not use any products that contain nicotine or tobacco, such as cigarettes and e-cigarettes. If you need help quitting, ask your health care provider. Summary  Having a healthy lifestyle and getting preventive care can help to protect your health and wellness after age 55.  Screening and testing are the best way to find a health problem early and help you avoid having a fall. Early diagnosis and treatment give you the best chance for managing medical conditions that are more common for people who are older than age 4.  Falls are a major  cause of broken bones and head injuries in people who are older than age 98. Take precautions to prevent a fall at home.  Work with your health care provider to learn what changes you can make to improve your health and wellness and to prevent falls. This information is not intended to replace advice given to you by your health care provider. Make sure you discuss any questions you have with your health care provider. Document Revised: 10/13/2018 Document Reviewed: 05/05/2017 Elsevier Patient Education  [employer].      If you have lab work done today you will be contacted with your lab results within the next 2 weeks.  If you have not heard from [place] then please contact us. The fastest way to get your results is to register for My Chart.   IF you received an x-ray today, you will receive an invoice from [facility]. Please contact [facility] at [phone] with questions or concerns regarding your invoice.   IF you received labwork today, you will receive an invoice from [facility]. Please contact [facility] at [phone] with questions or concerns regarding your invoice.   Our billing staff will not be able to assist you with questions regarding bills from these companies.  You will be contacted with the lab results as soon as they are available. The fastest way to get your results is to activate your My Chart account. Instructions are located on the last page of this paperwork. If you have not heard from [place] regarding the results in 2 weeks, please contact this office.         Signed, Merri Ray, MD [facility]

## 2020-06-09 DIAGNOSIS — R059 Cough, unspecified: Secondary | ICD-10-CM | POA: Diagnosis not present

## 2020-06-09 DIAGNOSIS — J441 Chronic obstructive pulmonary disease with (acute) exacerbation: Secondary | ICD-10-CM | POA: Diagnosis not present

## 2020-06-09 DIAGNOSIS — I10 Essential (primary) hypertension: Secondary | ICD-10-CM | POA: Diagnosis not present

## 2020-06-09 DIAGNOSIS — J209 Acute bronchitis, unspecified: Secondary | ICD-10-CM | POA: Diagnosis not present

## 2020-07-03 DIAGNOSIS — Z23 Encounter for immunization: Secondary | ICD-10-CM | POA: Diagnosis not present

## 2020-08-08 DIAGNOSIS — L570 Actinic keratosis: Secondary | ICD-10-CM | POA: Diagnosis not present

## 2020-08-08 DIAGNOSIS — D225 Melanocytic nevi of trunk: Secondary | ICD-10-CM | POA: Diagnosis not present

## 2020-08-08 DIAGNOSIS — L821 Other seborrheic keratosis: Secondary | ICD-10-CM | POA: Diagnosis not present

## 2020-11-21 ENCOUNTER — Ambulatory Visit (INDEPENDENT_AMBULATORY_CARE_PROVIDER_SITE_OTHER): Payer: PPO | Admitting: Family Medicine

## 2020-11-21 ENCOUNTER — Encounter: Payer: Self-pay | Admitting: Family Medicine

## 2020-11-21 ENCOUNTER — Other Ambulatory Visit: Payer: Self-pay

## 2020-11-21 VITALS — BP 160/76 | HR 68 | Temp 98.0°F | Resp 17 | Ht 64.0 in | Wt 151.6 lb

## 2020-11-21 DIAGNOSIS — F439 Reaction to severe stress, unspecified: Secondary | ICD-10-CM | POA: Diagnosis not present

## 2020-11-21 DIAGNOSIS — Z23 Encounter for immunization: Secondary | ICD-10-CM

## 2020-11-21 DIAGNOSIS — I10 Essential (primary) hypertension: Secondary | ICD-10-CM | POA: Diagnosis not present

## 2020-11-21 DIAGNOSIS — J449 Chronic obstructive pulmonary disease, unspecified: Secondary | ICD-10-CM | POA: Diagnosis not present

## 2020-11-21 LAB — BASIC METABOLIC PANEL
BUN: 14 mg/dL (ref 6–23)
CO2: 27 mEq/L (ref 19–32)
Calcium: 9.9 mg/dL (ref 8.4–10.5)
Chloride: 103 mEq/L (ref 96–112)
Creatinine, Ser: 1.14 mg/dL (ref 0.40–1.20)
GFR: 46.06 mL/min — ABNORMAL LOW (ref >60.00)
Glucose, Bld: 88 mg/dL (ref 70–99)
Potassium: 3.9 mEq/L (ref 3.5–5.1)
Sodium: 139 mEq/L (ref 135–145)

## 2020-11-21 MED ORDER — AMLODIPINE BESYLATE 5 MG PO TABS: 5.0000 mg | ORAL_TABLET | Freq: Every day | ORAL | 2 refills | Status: DC

## 2020-11-21 MED ORDER — BREO ELLIPTA 100-25 MCG/INH IN AEPB: INHALATION_SPRAY | RESPIRATORY_TRACT | 6 refills | Status: AC

## 2020-11-21 MED ORDER — ALBUTEROL SULFATE HFA 108 (90 BASE) MCG/ACT IN AERS: 1.0000 | INHALATION_SPRAY | RESPIRATORY_TRACT | 1 refills | Status: AC | PRN

## 2020-11-21 NOTE — Progress Notes (Signed)
Subjective:  Patient ID: Maria Olsen, female    DOB: 13-Mar-1941  Age: 80 y.o. MRN: [id number]  CC:  Chief Complaint  Patient presents with  . Hypertension    Pt reports under a lot of stress recently, pt denies physical symptoms checked patient home monitor which is close in measurements to in office readings. Pt reports 135/73 at home before visit this am. 172.79 pt cuff  162/84 office cuff   . Medication Refill    Pt needs refill Flonase and inhaler no concerns these have worked well for her in the past     HPI Maria Olsen presents for   Hypertension: Amlodipine 5 mg daily.  Increased stress recently. Daughter Maria Olsen had emergency C-section last week, complications in infant (Maria Olsen) SMA and now clots to brain, planning on taking off ventilator today. Has been difficult for all. Has supportive friends and pastor.  Building townhouse in [place]. Will be looking for new PCP in [place].  Home readings: 135/73 this morning prior to office visit.  Reading on home meter as above here in office slightly higher. 120/60-70 usually. Prior creat 1.02-1.18, normal in November. Trying to drink more water. Ibuprofen rarely - 3-4 times since last visit.  BP Readings from Last 3 Encounters:  11/21/20 (!) 160/76  05/23/20 136/82  11/22/19 (!) 144/75   Lab Results  Component Value Date   CREATININE 0.91 05/21/2020    COPD Treated with Memory Dance Ellipta daily with albuterol as needed. Only needed albuterol once - usually not using. No nasal sprays.  Feels like symptoms are stable - only dyspneic if walking far. No new symptoms.  Exacerbation in December, none recent.  Has received COVID vaccination, did receive 1st booster 07/03/20.  Last Pneumovax in April 2017 - will repeat today.   Immunization History  Administered Date(s) Administered  . Fluad Quad(high Dose 65+) 04/05/2019  . Influenza Split 06/08/2012  . Influenza, High Dose Seasonal PF 03/26/2018  . Influenza,inj,Quad PF,6+  Mos 03/12/2017  . Influenza-Unspecified 04/13/2016, 03/26/2018, 04/26/2020  . PFIZER(Purple Top)SARS-COV-2 Vaccination 08/13/2019, 09/07/2019  . Pneumococcal Conjugate-13 05/21/2014  . Pneumococcal Polysaccharide-23 10/16/2015    History Patient Active Problem List   Diagnosis Date Noted  . Acute pharyngitis 05/21/2017  . COPD (chronic obstructive pulmonary disease) ([place]) 03/23/2017  . HTN (hypertension) 06/30/2013   Past Medical History:  Diagnosis Date  . Breast cancer ([place])   . Cancer ([place])   . COPD (chronic obstructive pulmonary disease) ([place])   . Hypertension    Past Surgical History:  Procedure Laterality Date  . BREAST SURGERY    . CESAREAN SECTION    . MASTECTOMY Left   . NECK SURGERY    . TUBAL LIGATION     Allergies  Allergen Reactions  . Xiidra [Lifitegrast]   . Demerol [Meperidine] Nausea And Vomiting  . Doxycycline     Sick on the stomach  . Monocid [Cefonicid] Nausea And Vomiting   Prior to Admission medications   Medication Sig Start Date End Date Taking? Authorizing Provider  acetaminophen (TYLENOL) 500 MG tablet Take 500 mg by mouth every 6 (six) hours as needed.   Yes [provider]  albuterol (PROVENTIL HFA;VENTOLIN HFA) 108 (90 Base) MCG/ACT inhaler Inhale 1-2 puffs into the lungs every 4 (four) hours as needed for wheezing or shortness of breath. 03/31/18  Yes [name], MD  amLODipine (NORVASC) 5 MG tablet Take 1 tablet (5 mg total) by mouth daily. 05/23/20  Yes [name], MD  calcium-vitamin D (OSCAL-500) 500-400 MG-UNIT tablet Take 1 tablet by mouth daily.   Yes [provider]  fluticasone furoate-vilanterol (BREO ELLIPTA) 100-25 MCG/INH AEPB INHALE 1 PUFF EVERY DAY 05/23/20  Yes [name], MD  ibuprofen (ADVIL,MOTRIN) 200 MG tablet Take 200 mg by mouth every 6 (six) hours as needed.   Yes [provider]  Multiple Vitamins-Minerals (MULTIVITAMIN ADULT PO) Take by mouth.   Yes [provider]   Social History   Socioeconomic History  . Marital status: Widowed    Spouse name: Not on file  . Number of children: Not on file  . Years of education: Not on file  . Highest education level: Not on file  Occupational History  . Occupation: Therapist, sports  Tobacco Use  . Smoking status: Former Smoker    Types: Cigarettes  . Smokeless tobacco: Never Used  Vaping Use  . Vaping Use: Never used  Substance and Sexual Activity  . Alcohol use: No    Alcohol/week: 0.0 standard drinks    Comment: rare  . Drug use: No  . Sexual activity: Not Currently  Other Topics Concern  . Not on file  Social History Narrative   Married   Education: Secretary/administrator   Exercise: some   Social Determinants of Health   Financial Resource Strain: Not on file  Food Insecurity: Not on file  Transportation Needs: Not on file  Physical Activity: Not on file  Stress: Not on file  Social Connections: Not on file  Intimate Partner Violence: Not on file    Review of Systems   Objective:   Vitals:   11/21/20 0748 11/21/20 0756  BP: (!) 162/84 (!) 160/76  Pulse: 68   Resp: 17   Temp: 98 F (36.7 C)   TempSrc: Temporal   SpO2: 97%   Weight: 151 lb 9.6 oz (68.8 kg)   Height: 5\' 4"  (1.626 m)      Physical Exam Vitals reviewed.  Constitutional:      Appearance: She is well-developed.  HENT:     Head: Normocephalic and atraumatic.  Eyes:     Conjunctiva/sclera: Conjunctivae normal.     Pupils: Pupils are equal, round, and reactive to light.  Neck:     Vascular: No carotid bruit.  Cardiovascular:     Rate and Rhythm: Normal rate and regular rhythm.     Heart sounds: Normal heart sounds.  Pulmonary:     Effort: Pulmonary effort is normal.     Breath sounds: Normal breath sounds.  Abdominal:     Palpations: Abdomen is soft. There is no pulsatile mass.     Tenderness: There is no abdominal tenderness.  Skin:    General: Skin is warm and dry.  Neurological:     Mental Status: She is  alert and oriented to person, place, and time.  Psychiatric:        Behavior: Behavior normal.        Assessment & Plan:  [name] is a 80 y.o. female . Essential hypertension - Plan: amLODipine (NORVASC) 5 MG tablet, Basic metabolic panel  - borderline elevated in office, normal home readings.  Situational stressors as above.  Continue same dose of amlodipine for now with home monitoring, check creatinine as prior elevation yet normalized last visit.  Maintain hydration.  Minimize use of NSAIDs as much as possible.  45-month follow-up with myself or new PCP if needed based on her upcoming move.  Chronic obstructive pulmonary disease, unspecified COPD type (  [place]) - Plan: fluticasone furoate-vilanterol (BREO ELLIPTA) 100-25 MCG/INH AEPB, albuterol (VENTOLIN HFA) 108 (90 Base) MCG/ACT inhaler, Pneumococcal polysaccharide vaccine 23-valent greater than or equal to 2yo subcutaneous/IM  -Stable with current regimen, continue Breo, albuterol refilled if needed.  Updated Pneumovax given.  Recommended second COVID booster.  Need for vaccination against Streptococcus pneumoniae - Plan: Pneumococcal polysaccharide vaccine 23-valent greater than or equal to 2yo subcutaneous/IM Pneumovax given  Situational stress  Very unfortunate situation as above.  She denies needs for any resources at this time including counseling.  She does have a support system at home as well as with her pastor.  Advised to let me know if I can provide any assistance or resources.  Meds ordered this encounter  Medications  . amLODipine (NORVASC) 5 MG tablet    Sig: Take 1 tablet (5 mg total) by mouth daily.    Dispense:  90 tablet    Refill:  2  . fluticasone furoate-vilanterol (BREO ELLIPTA) 100-25 MCG/INH AEPB    Sig: INHALE 1 PUFF EVERY DAY    Dispense:  60 each    Refill:  6  . albuterol (VENTOLIN HFA) 108 (90 Base) MCG/ACT inhaler    Sig: Inhale 1-2 puffs into the lungs every 4 (four) hours as needed for  wheezing or shortness of breath.    Dispense:  1 each    Refill:  1   Patient Instructions  I am so sorry to hear of the news with [place].  Please let me know if I can provide any resources during this very difficult time.   Keep a record of your blood pressures outside of the office and if running over 140/90, we may need to adjust meds.   If you need a new primary provider in [place], I recommend Dr. Pamella Pert if she is accepting new patients:  Dr. Darius Bump [facility] [address], [place] [address] [phone]     Signed, Merri Ray, MD [facility]

## 2020-11-21 NOTE — Patient Instructions (Addendum)
I am so sorry to hear of the news with Scarlett.  Please let me know if I can provide any resources during this very difficult time.   Keep a record of your blood pressures outside of the office and if running over 140/90, we may need to adjust meds.   Pneumonia vaccine given today. I do recommend second covid 19 booster whenever you are ready.   If you need a new primary provider in [place], I recommend Dr. Pamella Pert if she is accepting new patients:  Dr. Darius Bump [facility] [address], [place] [address] [phone]  Thank you for coming in today and let me know if there are any questions.

## 2020-11-28 ENCOUNTER — Encounter: Payer: Self-pay | Admitting: Family Medicine

## 2020-11-28 ENCOUNTER — Telehealth (INDEPENDENT_AMBULATORY_CARE_PROVIDER_SITE_OTHER): Payer: PPO | Admitting: Family Medicine

## 2020-11-28 ENCOUNTER — Other Ambulatory Visit: Payer: Self-pay

## 2020-11-28 VITALS — Temp 97.8°F

## 2020-11-28 DIAGNOSIS — J441 Chronic obstructive pulmonary disease with (acute) exacerbation: Secondary | ICD-10-CM

## 2020-11-28 MED ORDER — PREDNISONE 20 MG PO TABS: 40.0000 mg | ORAL_TABLET | Freq: Every day | ORAL | 0 refills | Status: AC

## 2020-11-28 MED ORDER — AZITHROMYCIN 250 MG PO TABS: ORAL_TABLET | ORAL | 0 refills | Status: AC

## 2020-11-28 NOTE — Progress Notes (Signed)
Virtual Visit via Video Note  I connected with Maria Olsen on 11/28/20 at 1:19 PM by a video enabled telemedicine application and verified that I am speaking with the correct person using two identifiers.  Patient location: home  My location: office - [place].    I discussed the limitations, risks, security and privacy concerns of performing an evaluation and management service by telephone and the availability of in person appointments. I also discussed with the patient that there may be a patient responsible charge related to this service. The patient expressed understanding and agreed to proceed, consent obtained  Chief complaint:  Chief Complaint  Patient presents with  . Shortness of Breath    Pt reports some shortness of breath cough, mucus coming with cough, general fatigue, tested twice for COVID both negative    History of Present Illness: Maria Olsen is a 80 y.o. female  History of COPD. Usually treated with Adair Patter, rare need for albuterol at her most recent visit on 11/11/20 Reports some recent slight increased shortness of breath, cough, increased mucus. Started 3 days ago. Increased cough past few days with thick yellow-green mucus with cough overnight.  Negative rapid covid test yesterday and this morning.  Has not yet taken albuterol. Minimal dyspnea.  No fever. Temp 97.8 Some hoarseness. No confusion, disorientation  Drinking fluids ok.  No known sick contacts.   Covid vaccine: primary vaccine in feb/march last year. Booster 07/03/20.   Patient Active Problem List   Diagnosis Date Noted  . Acute pharyngitis 05/21/2017  . COPD (chronic obstructive pulmonary disease) ([place]) 03/23/2017  . HTN (hypertension) 06/30/2013   Past Medical History:  Diagnosis Date  . Breast cancer ([place])   . Cancer ([place])   . COPD (chronic obstructive pulmonary disease) ([place])   . Hypertension    Past Surgical History:  Procedure Laterality Date  . BREAST SURGERY    .  CESAREAN SECTION    . MASTECTOMY Left   . NECK SURGERY    . TUBAL LIGATION     Allergies  Allergen Reactions  . Xiidra [Lifitegrast]   . Demerol [Meperidine] Nausea And Vomiting  . Doxycycline     Sick on the stomach  . Monocid [Cefonicid] Nausea And Vomiting   Prior to Admission medications   Medication Sig Start Date End Date Taking? Authorizing Provider  acetaminophen (TYLENOL) 500 MG tablet Take 500 mg by mouth every 6 (six) hours as needed.   Yes [provider]  albuterol (VENTOLIN HFA) 108 (90 Base) MCG/ACT inhaler Inhale 1-2 puffs into the lungs every 4 (four) hours as needed for wheezing or shortness of breath. 11/21/20  Yes [name], MD  amLODipine (NORVASC) 5 MG tablet Take 1 tablet (5 mg total) by mouth daily. 11/21/20  Yes [name], MD  calcium-vitamin D (OSCAL-500) 500-400 MG-UNIT tablet Take 1 tablet by mouth daily.   Yes [provider]  fluticasone furoate-vilanterol (BREO ELLIPTA) 100-25 MCG/INH AEPB INHALE 1 PUFF EVERY DAY 11/21/20  Yes [name], MD  ibuprofen (ADVIL,MOTRIN) 200 MG tablet Take 200 mg by mouth every 6 (six) hours as needed.   Yes [provider]  Multiple Vitamins-Minerals (MULTIVITAMIN ADULT PO) Take by mouth.   Yes [provider]   Social History   Socioeconomic History  . Marital status: Widowed    Spouse name: Not on file  . Number of children: Not on file  . Years of education: Not on file  . Highest education  level: Not on file  Occupational History  . Occupation: Therapist, sports  Tobacco Use  . Smoking status: Former Smoker    Types: Cigarettes  . Smokeless tobacco: Never Used  Vaping Use  . Vaping Use: Never used  Substance and Sexual Activity  . Alcohol use: No    Alcohol/week: 0.0 standard drinks    Comment: rare  . Drug use: No  . Sexual activity: Not Currently  Other Topics Concern  . Not on file  Social History Narrative   Married   Education: Secretary/administrator   Exercise: some    Social Determinants of Radio broadcast assistant Strain: Not on file  Food Insecurity: Not on file  Transportation Needs: Not on file  Physical Activity: Not on file  Stress: Not on file  Social Connections: Not on file  Intimate Partner Violence: Not on file    Observations/Objective: Vitals:   11/28/20 1237  Temp: 97.8 F (36.6 C)  Nontoxic appearance on video.  Minimally shortened sentences but no respiratory distress.  Minimal cough during visit.  Slight hoarse voice.  Euthymic mood, appropriate responses.  All questions were answered with understanding of plan expressed  Assessment and Plan: COPD exacerbation ([place]) Suspected COPD exacerbation with increased and discolored mucus.  Slight increased dyspnea.    -Start prednisone 40 mg daily x5 days, potential side effects discussed, azithromycin Z-Pak.  Potential side effects discussed.  -Continue chronic meds with addition of albuterol if needed for wheezing/shortness of breath.  Reassuring with 2 negative COVID tests, but recommended repeat testing once more tomorrow based on timing of previous test.  If positive advised to call back for medication options.  -ER/urgent care precautions given Follow Up Instructions: ER/urgent care if not improving, sooner if worse   I discussed the assessment and treatment plan with the patient. The patient was provided an opportunity to ask questions and all were answered. The patient agreed with the plan and demonstrated an understanding of the instructions.   The patient was advised to call back or seek an in-person evaluation if the symptoms worsen or if the condition fails to improve as anticipated.  I provided 42minutes of non-face-to-face time during this encounter.   [name], MD

## 2020-12-17 ENCOUNTER — Encounter: Payer: Self-pay | Admitting: Family Medicine

## 2021-05-18 ENCOUNTER — Other Ambulatory Visit: Payer: Self-pay | Admitting: Family Medicine

## 2021-05-18 DIAGNOSIS — I10 Essential (primary) hypertension: Secondary | ICD-10-CM

## 2021-05-27 DIAGNOSIS — I10 Essential (primary) hypertension: Secondary | ICD-10-CM | POA: Diagnosis not present

## 2021-05-27 DIAGNOSIS — Z1231 Encounter for screening mammogram for malignant neoplasm of breast: Secondary | ICD-10-CM | POA: Diagnosis not present

## 2021-05-27 DIAGNOSIS — J449 Chronic obstructive pulmonary disease, unspecified: Secondary | ICD-10-CM | POA: Diagnosis not present

## 2021-06-06 ENCOUNTER — Encounter: Payer: Self-pay | Admitting: Family Medicine

## 2021-06-09 DIAGNOSIS — B349 Viral infection, unspecified: Secondary | ICD-10-CM | POA: Diagnosis not present

## 2021-06-09 DIAGNOSIS — J40 Bronchitis, not specified as acute or chronic: Secondary | ICD-10-CM | POA: Diagnosis not present

## 2021-08-11 DIAGNOSIS — D485 Neoplasm of uncertain behavior of skin: Secondary | ICD-10-CM | POA: Diagnosis not present

## 2021-08-11 DIAGNOSIS — D2239 Melanocytic nevi of other parts of face: Secondary | ICD-10-CM | POA: Diagnosis not present

## 2021-08-11 DIAGNOSIS — L821 Other seborrheic keratosis: Secondary | ICD-10-CM | POA: Diagnosis not present

## 2021-08-11 DIAGNOSIS — D225 Melanocytic nevi of trunk: Secondary | ICD-10-CM | POA: Diagnosis not present

## 2021-09-03 IMAGING — MG DIGITAL SCREENING UNILAT RIGHT W/ TOMO W/ CAD
4 series · 4 of 12 positions shown · non-contrast
Comparison: Previous exam(s).

CLINICAL DATA: Screening. Personal history of malignant LEFT
mastectomy.

EXAM:
DIGITAL SCREENING UNILATERAL RIGHT MAMMOGRAM WITH CAD AND TOMO

[R MLO synth-2D]
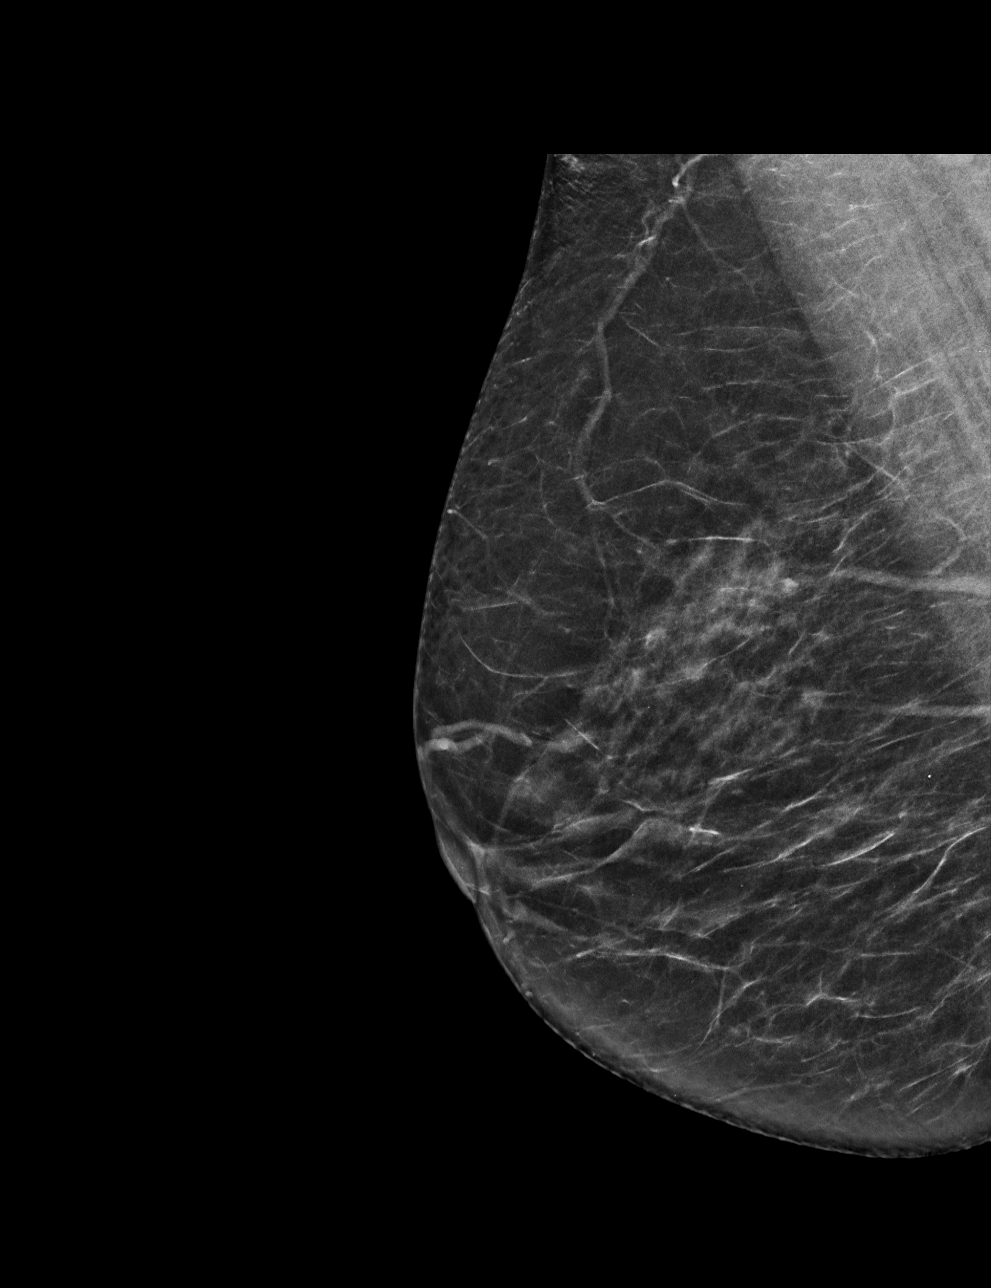

[R CC synth-2D]
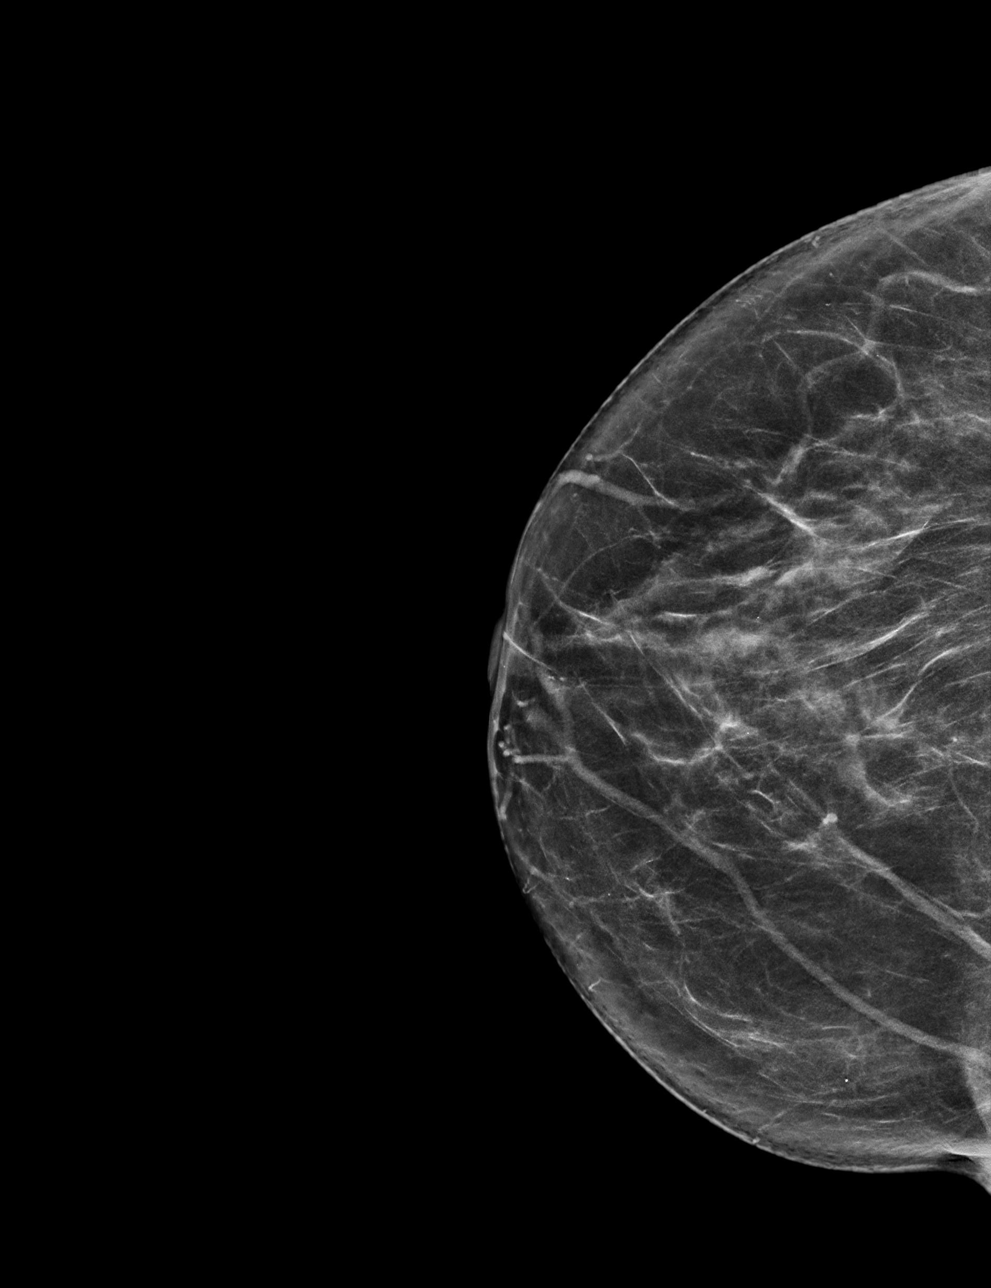

[R MLO tomo · tomo slice 31/61.0]
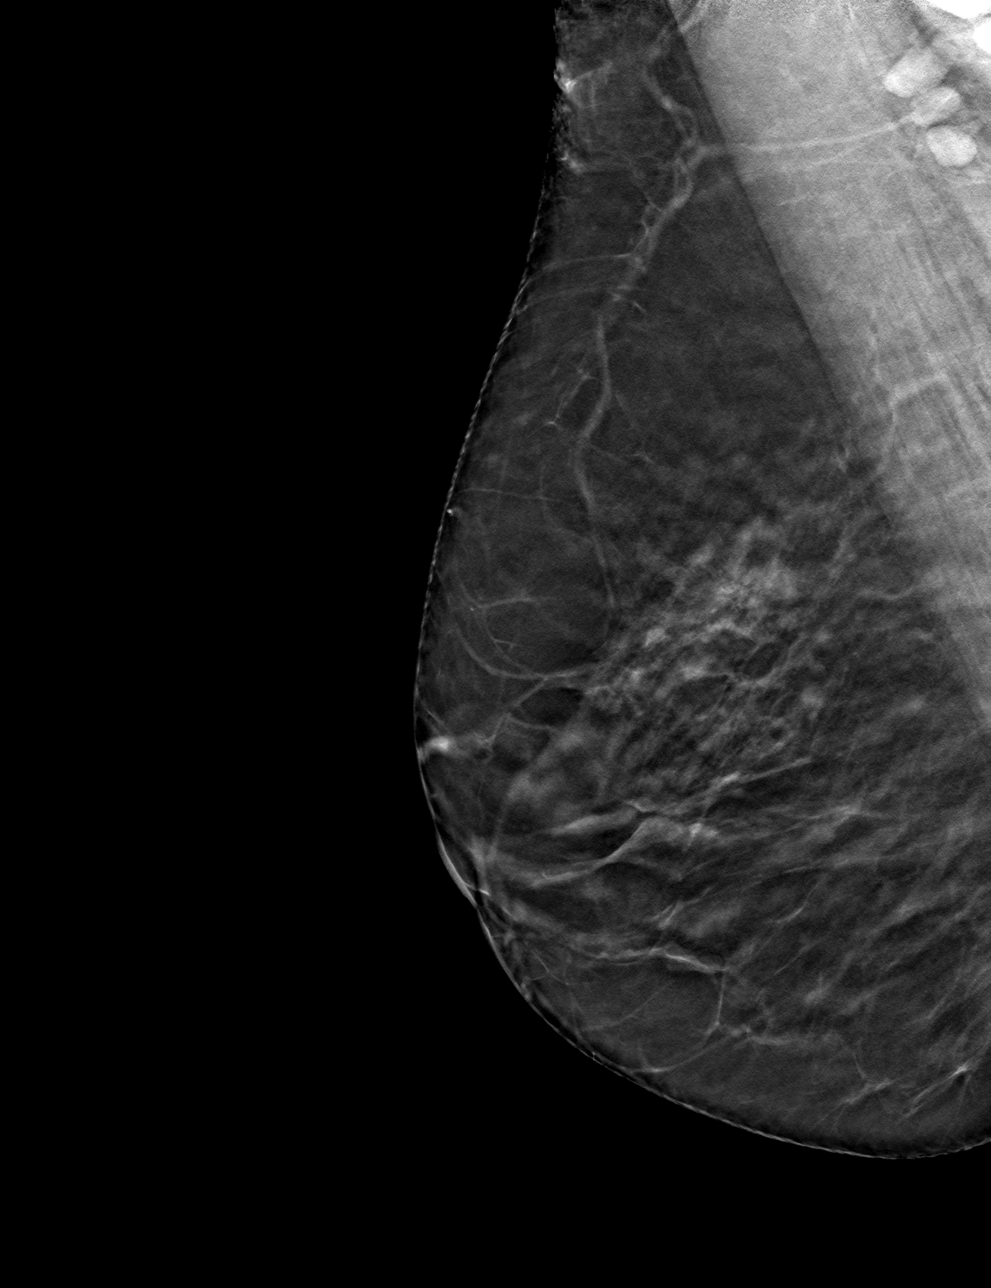

[R CC tomo · tomo slice 33/65.0]
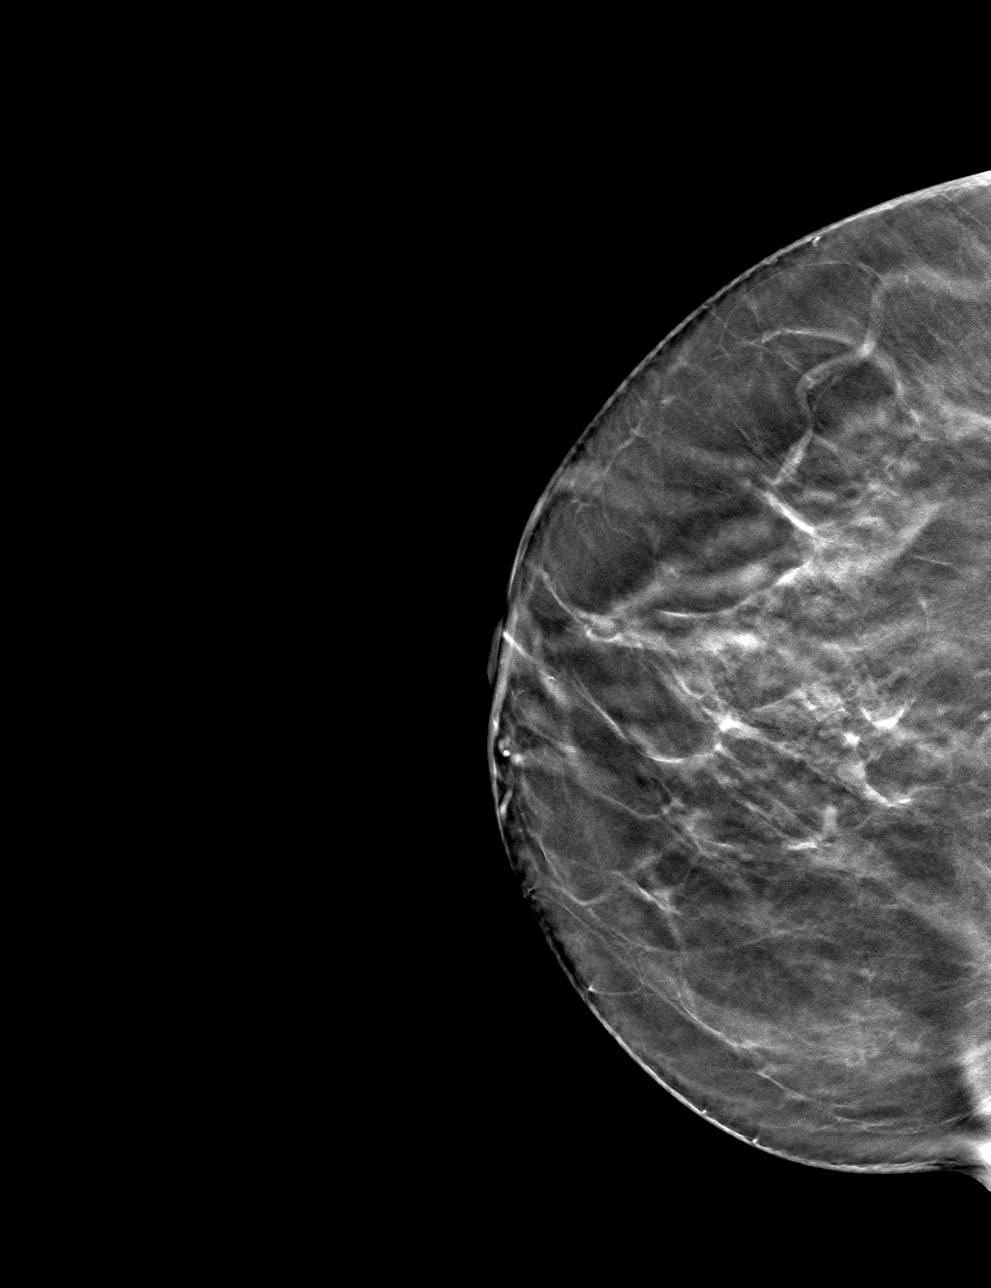

[4 of 12 positions shown; findings below may reference images not displayed]

ACR Breast Density Category b: There are scattered areas of
fibroglandular density.
FINDINGS: The patient has had a left mastectomy. There are no findings
suspicious for malignancy in the RIGHT breast. Images were processed
with CAD.
IMPRESSION: No mammographic evidence of malignancy. A result letter of this
screening mammogram will be mailed directly to the patient.

RECOMMENDATION:
Screening mammogram in one year.  (Code:QK-6-VDK)

BI-RADS CATEGORY  1: Negative.

## 2021-11-10 NOTE — Telephone Encounter (Signed)
NA

## 2022-07-03 ENCOUNTER — Encounter: Payer: Self-pay | Admitting: Family Medicine

## 2022-07-10 LAB — HM MAMMOGRAPHY

## 2022-07-14 ENCOUNTER — Encounter: Payer: Self-pay | Admitting: Family Medicine

## 2024-07-18 ENCOUNTER — Other Ambulatory Visit (HOSPITAL_BASED_OUTPATIENT_CLINIC_OR_DEPARTMENT_OTHER): Payer: Self-pay

## 2024-07-18 DIAGNOSIS — Z1231 Encounter for screening mammogram for malignant neoplasm of breast: Secondary | ICD-10-CM

## 2024-07-18 DIAGNOSIS — M810 Age-related osteoporosis without current pathological fracture: Secondary | ICD-10-CM

## 2024-07-19 ENCOUNTER — Other Ambulatory Visit (HOSPITAL_BASED_OUTPATIENT_CLINIC_OR_DEPARTMENT_OTHER): Admitting: Radiology

## 2024-07-19 ENCOUNTER — Ambulatory Visit (HOSPITAL_BASED_OUTPATIENT_CLINIC_OR_DEPARTMENT_OTHER): Admitting: Radiology

## 2024-07-20 ENCOUNTER — Ambulatory Visit (INDEPENDENT_AMBULATORY_CARE_PROVIDER_SITE_OTHER)
Admission: RE | Admit: 2024-07-20 | Discharge: 2024-07-20 | Disposition: A | Discharge status: Home / Self Care | Source: Ambulatory Visit

## 2024-07-20 DIAGNOSIS — Z1231 Encounter for screening mammogram for malignant neoplasm of breast: Secondary | ICD-10-CM

## 2024-07-20 DIAGNOSIS — M810 Age-related osteoporosis without current pathological fracture: Secondary | ICD-10-CM | POA: Diagnosis not present
